# Patient Record
Sex: Female | Born: 1955 | Race: White | Hispanic: No | Smoking: Never smoker
Health system: Southern US, Community
[De-identification: ages and names within clinical notes are randomized; demographics above are authoritative.]

## PROBLEM LIST (undated history)

## (undated) ENCOUNTER — Emergency Department: Disposition: A | Discharge status: Home / Self Care | Payer: Medicare Other

## (undated) DIAGNOSIS — R161 Splenomegaly, not elsewhere classified: Secondary | ICD-10-CM

## (undated) DIAGNOSIS — F419 Anxiety disorder, unspecified: Secondary | ICD-10-CM

## (undated) DIAGNOSIS — F32A Depression, unspecified: Secondary | ICD-10-CM

## (undated) DIAGNOSIS — F329 Major depressive disorder, single episode, unspecified: Secondary | ICD-10-CM

## (undated) HISTORY — DX: Depression, unspecified: F32.A

## (undated) HISTORY — DX: Splenomegaly, not elsewhere classified: R16.1

## (undated) HISTORY — DX: Anxiety disorder, unspecified: F41.9

---
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
## 1993-10-03 HISTORY — PX: ABDOMINAL HYSTERECTOMY: SHX81

## 2013-05-03 DIAGNOSIS — F32A Depression, unspecified: Secondary | ICD-10-CM | POA: Insufficient documentation

## 2014-03-11 DIAGNOSIS — M6530 Trigger finger, unspecified finger: Secondary | ICD-10-CM | POA: Insufficient documentation

## 2014-05-23 DIAGNOSIS — G4733 Obstructive sleep apnea (adult) (pediatric): Secondary | ICD-10-CM | POA: Insufficient documentation

## 2017-10-16 DIAGNOSIS — R5383 Other fatigue: Secondary | ICD-10-CM | POA: Insufficient documentation

## 2018-07-04 DIAGNOSIS — E669 Obesity, unspecified: Secondary | ICD-10-CM | POA: Insufficient documentation

## 2018-07-13 DIAGNOSIS — K5792 Diverticulitis of intestine, part unspecified, without perforation or abscess without bleeding: Secondary | ICD-10-CM | POA: Insufficient documentation

## 2018-07-13 DIAGNOSIS — I10 Essential (primary) hypertension: Secondary | ICD-10-CM | POA: Insufficient documentation

## 2018-10-09 DIAGNOSIS — R768 Other specified abnormal immunological findings in serum: Secondary | ICD-10-CM | POA: Insufficient documentation

## 2019-12-05 ENCOUNTER — Encounter: Payer: Self-pay | Admitting: Pain Medicine

## 2019-12-05 ENCOUNTER — Telehealth: Payer: Self-pay

## 2019-12-05 DIAGNOSIS — Z79899 Other long term (current) drug therapy: Secondary | ICD-10-CM | POA: Insufficient documentation

## 2019-12-05 DIAGNOSIS — M899 Disorder of bone, unspecified: Secondary | ICD-10-CM | POA: Insufficient documentation

## 2019-12-05 DIAGNOSIS — G894 Chronic pain syndrome: Secondary | ICD-10-CM | POA: Insufficient documentation

## 2019-12-05 DIAGNOSIS — Z789 Other specified health status: Secondary | ICD-10-CM | POA: Insufficient documentation

## 2019-12-05 NOTE — Telephone Encounter (Signed)
Unable to reach patient , Left message for patient to return call so that we call review her history for Mondays appt.

## 2019-12-05 NOTE — Progress Notes (Deleted)
Patient: Mandy Miller  Service Category: E/M  Provider: Gaspar Cola, MD  DOB: Dec 21, 1955  DOS: 12/09/2019  Location: Office  MRN: [id number]  Setting: Ambulatory outpatient  Referring Provider: [facility], [facility] *  Type: New Patient  Specialty: Interventional Pain Management  PCP: [facility], [place]  Location: Remote location  Delivery: TeleHealth     Virtual Encounter - Pain Management PROVIDER NOTE: Information contained herein reflects review and annotations entered in association with encounter. Interpretation of such information and data should be left to medically-trained personnel. Information provided to patient can be located elsewhere in the medical record under "Patient Instructions". Document created using STT-dictation technology, any transcriptional errors that may result from process are unintentional.    Contact & Pharmacy Preferred: [place]: [phone] (home) Mobile: [phone] (mobile) E-mail: No e-mail address on record No Pharmacies Listed  Pre-screening note:  Our staff contacted Mandy Miller and offered her an "in person", "face-to-face" appointment versus a telephone encounter. She indicated preferring the telephone encounter, at this time.  Primary Reason(s) for Visit: Tele-Encounter for initial evaluation of one or more chronic problems (new to examiner) potentially causing chronic pain, and posing a threat to normal musculoskeletal function. (Level of risk: High) CC: No chief complaint on file.  I contacted Mandy Miller on 12/09/2019 via telephone.      I clearly identified myself as Gaspar Cola, MD. I verified that I was speaking with the correct person using two identifiers (Name: Mandy Miller, and date of birth: 1956-10-03).  Advanced Informed Consent I sought verbal advanced consent from Mandy Miller for virtual visit interactions. I informed Mandy Miller of possible security and privacy concerns, risks, and limitations associated with  providing "not-in-person" medical evaluation and management services. I also informed Mandy Miller of the availability of "in-person" appointments. Finally, I informed her that there would be a charge for the virtual visit and that she could be  personally, fully or partially, financially responsible for it. Mandy Miller expressed understanding and agreed to proceed.   HPI  Mandy Miller is a 64 y.o. year old, female patient, contacted today for an initial evaluation of her chronic pain. She has Chronic pain syndrome; Pharmacologic therapy; Disorder of skeletal system; and Problems influencing health status on their problem list.   Onset and Duration: Sudden, Started with accident and Present longer than 3 months Cause of pain: fell Severity: Getting worse, NAS-11 at its worse: 10/10, NAS-11 at its best: 4/10, NAS-11 now: 3/10 and NAS-11 on the average: 5/10 Timing: During activity or exercise and After activity or exercise Aggravating Factors: Working Alleviating Factors: nothing currently Associated Problems: Weakness, Pain that wakes patient up and Pain that does not allow patient to sleep Quality of Pain: Aching, Constant, Sharp and Uncomfortable Previous Examinations or Tests: The patient denies states she had a cortisone shot into shoulder but did not work well Previous Treatments: The patient denies has tried tylenol and ibuprofen and does not help   ***  Historic Controlled Substance Pharmacotherapy Review  Current opioid analgesics: Oxycodone IR 10 mg (#5 pills) (last prescribed 07/11/2019) Highest recorded MME/day: 30 mg/day MME/day: 18.75 mg/day   Historical Monitoring: The patient  has no history on file for drug. List of all UDS Test(s): No results found. List of other Serum/Urine Drug Screening Test(s):  No results found. Historical Background Evaluation: [place] PMP: PDMP reviewed during this encounter. Two (2) year initial data search conducted.             PMP  NARX Score Report:   Narcotic: 110 Sedative: 060 Stimulant: 000 Risk Assessment Profile: PMP NARX Overdose Risk Score: 230  Pharmacologic Plan: As per protocol, I have not taken over any controlled substance management, pending the results of ordered tests and/or consults.            Initial impression: Pending review of available data and ordered tests.  Meds  No current outpatient medications on file.  ROS  Cardiovascular: No reported cardiovascular signs or symptoms such as High blood pressure, coronary artery disease, abnormal heart rate or rhythm, heart attack, blood thinner therapy or heart weakness and/or failure Pulmonary or Respiratory: Temporary stoppage of breathing during sleep Neurological: No reported neurological signs or symptoms such as seizures, abnormal skin sensations, urinary and/or fecal incontinence, being born with an abnormal open spine and/or a tethered spinal cord Psychological-Psychiatric: No reported psychological or psychiatric signs or symptoms such as difficulty sleeping, anxiety, depression, delusions or hallucinations (schizophrenial), mood swings (bipolar disorders) or suicidal ideations or attempts Gastrointestinal: No reported gastrointestinal signs or symptoms such as vomiting or evacuating blood, reflux, heartburn, alternating episodes of diarrhea and constipation, inflamed or scarred liver, or pancreas or irrregular and/or infrequent bowel movements Genitourinary: No reported renal or genitourinary signs or symptoms such as difficulty voiding or producing urine, peeing blood, non-functioning kidney, kidney stones, difficulty emptying the bladder, difficulty controlling the flow of urine, or chronic kidney disease Hematological: No reported hematological signs or symptoms such as prolonged bleeding, low or poor functioning platelets, bruising or bleeding easily, hereditary bleeding problems, low energy levels due to low hemoglobin or being anemic Endocrine: No reported  endocrine signs or symptoms such as high or low blood sugar, rapid heart rate due to high thyroid levels, obesity or weight gain due to slow thyroid or thyroid disease Rheumatologic: No reported rheumatological signs and symptoms such as fatigue, joint pain, tenderness, swelling, redness, heat, stiffness, decreased range of motion, with or without associated rash Musculoskeletal: Negative for myasthenia gravis, muscular dystrophy, multiple sclerosis or malignant hyperthermia Work History: Working full time  Allergies  Ms. Collier has no allergies on file.  Laboratory Chemistry Profile   Renal No results found for: BUN, CREATININE, LABCREA, BCR, GFR, GFRAA, GFRNONAA, SPECGRAV, PHUR, PROTEINUR  Electrolytes No results found for: NA, K, CL, CALCIUM, MG, PHOS  Hepatic No results found for: AST, ALT, ALBUMIN, ALKPHOS, AMYLASE, LIPASE, AMMONIA  ID No results found for: LYMEIGGIGMAB, HIV, SARSCOV2NAA, STAPHAUREUS, MRSAPCR, HCVAB, PREGTESTUR, RMSFIGG, QFVRPH1IGG, QFVRPH2IGG, LYMEIGGIGMAB  Bone No results found for: VD25OH, XL244WN0UVO, ZD6644IH4, VQ2595GL8, 25OHVITD1, 25OHVITD2, 25OHVITD3, TESTOFREE, TESTOSTERONE  Endocrine No results found for: GLUCOSE, GLUCOSEU, HGBA1C, TSH, FREET4, TESTOFREE, TESTOSTERONE, SHBG, ESTRADIOL, ESTRADIOLPCT, ESTRADIOLFRE, LABPREG, ACTH, CRTSLPL, UCORFRPERLTR, UCORFRPERDAY, CORTISOLBASE, LABPREG  Neuropathy No results found for: VITAMINB12, FOLATE, HGBA1C, HIV  CNS No results found for: COLORCSF, APPEARCSF, RBCCOUNTCSF, WBCCSF, POLYSCSF, LYMPHSCSF, EOSCSF, PROTEINCSF, GLUCCSF, JCVIRUS, CSFOLI, IGGCSF, LABACHR, ACETBL, LABACHR, ACETBL  Inflammation (CRP: Acute  ESR: Chronic) No results found for: CRP, ESRSEDRATE, LATICACIDVEN  Rheumatology No results found for: RF, ANA, LABURIC, URICUR, LYMEIGGIGMAB, LYMEABIGMQN, HLAB27  Coagulation No results found for: INR, LABPROT, APTT, PLT, DDIMER, LABHEMA, VITAMINK1, AT3  Cardiovascular No results found for: BNP, CKTOTAL, CKMB,  TROPONINI, HGB, HCT, LABVMA, EPIRU, EPINEPH24HUR, NOREPRU, NOREPI24HUR, DOPARU, DOPAM24HRUR  Screening No results found for: SARSCOV2NAA, COVIDSOURCE, STAPHAUREUS, MRSAPCR, HCVAB, HIV, PREGTESTUR  Cancer No results found for: CEA, CA125, LABCA2  Allergens No results found for: ALMOND, APPLE, ASPARAGUS, AVOCADO, BANANA, BARLEY, BASIL, BAYLEAF, GREENBEAN, LIMABEAN, WHITEBEAN, BEEFIGE, REDBEET, BLUEBERRY, BROCCOLI, CABBAGE,  MELON, CARROT, CASEIN, CASHEWNUT, CAULIFLOWER, CELERY      Note: No results found under the [facility] electronic medical record  Imaging Review   Complexity Note: No results found under the [employer] electronic medical record.                         PFSH  Drug: Ms. Gauthreaux  has no history on file for drug. Alcohol:  has no history on file for alcohol. Tobacco:  has no history on file for tobacco. Medical:  has no past medical history on file. Family: family history is not on file.  *** The histories are not reviewed yet. Please review them in the "History" navigator section and refresh this [place]. Active Ambulatory Problems    Diagnosis Date Noted  . Chronic pain syndrome 12/05/2019  . Pharmacologic therapy 12/05/2019  . Disorder of skeletal system 12/05/2019  . Problems influencing health status 12/05/2019   Resolved Ambulatory Problems    Diagnosis Date Noted  . No Resolved Ambulatory Problems   No Additional Past Medical History   Assessment  Primary Diagnosis & Pertinent Problem List: The primary encounter diagnosis was Chronic pain syndrome. Diagnoses of Pharmacologic therapy, Disorder of skeletal system, and Problems influencing health status were also pertinent to this visit.  Visit Diagnosis (New problems to examiner): 1. Chronic pain syndrome   2. Pharmacologic therapy   3. Disorder of skeletal system   4. Problems influencing health status    Plan of Care (Initial workup plan)  Note: Ms. Nesser was reminded that as per protocol,  today's visit has been an evaluation only. We have not taken over the patient's controlled substance management.  Problem-specific plan: No problem-specific Assessment & Plan notes found for this encounter.  Lab Orders  No laboratory test(s) ordered today   Imaging Orders  No imaging studies ordered today   Referral Orders  No referral(s) requested today   Procedure Orders    No procedure(s) ordered today   Pharmacotherapy (current): Medications ordered:  No orders of the defined types were placed in this encounter.    Pharmacological management options:  Opioid Analgesics: The patient was informed that there is no guarantee that she would be a candidate for opioid analgesics. The decision will be made following CDC guidelines. This decision will be based on the results of diagnostic studies, as well as Ms. Soeder's risk profile.   Membrane stabilizer: To be determined at a later time  Muscle relaxant: To be determined at a later time  NSAID: To be determined at a later time  Other analgesic(s): To be determined at a later time   Interventional management options: Ms. Koehler was informed that there is no guarantee that she would be a candidate for interventional therapies. The decision will be based on the results of diagnostic studies, as well as Ms. Lajeunesse's risk profile.  Procedure(s) under consideration:  ***   Provider-requested follow-up: No follow-ups on file.  Future Appointments  Date Time Provider [place]  12/09/2019  2:30 PM Milinda Pointer, MD [facility] None   Total duration of encounter: *** minutes.  Primary Care Physician: Medicine, [place] Of Note by: Gaspar Cola, MD Date: 12/09/2019; Time: 6:46 AM

## 2019-12-09 ENCOUNTER — Ambulatory Visit: Payer: Medicare Other | Attending: Pain Medicine | Admitting: Pain Medicine

## 2019-12-09 ENCOUNTER — Other Ambulatory Visit: Payer: Self-pay

## 2019-12-09 DIAGNOSIS — G894 Chronic pain syndrome: Secondary | ICD-10-CM

## 2019-12-17 ENCOUNTER — Ambulatory Visit: Payer: Medicare Other | Admitting: Pain Medicine

## 2019-12-17 ENCOUNTER — Ambulatory Visit
Admission: RE | Admit: 2019-12-17 | Discharge: 2019-12-17 | Disposition: A | Discharge status: Home / Self Care | Payer: Medicare Other | Source: Ambulatory Visit | Attending: Pain Medicine | Admitting: Pain Medicine

## 2019-12-17 ENCOUNTER — Encounter: Payer: Self-pay | Admitting: Pain Medicine

## 2019-12-17 ENCOUNTER — Other Ambulatory Visit: Payer: Self-pay

## 2019-12-17 VITALS — BP 143/85 | HR 77 | Temp 97.3°F | Resp 16 | Ht 63.0 in | Wt 171.0 lb

## 2019-12-17 DIAGNOSIS — M792 Neuralgia and neuritis, unspecified: Secondary | ICD-10-CM | POA: Insufficient documentation

## 2019-12-17 DIAGNOSIS — G8929 Other chronic pain: Secondary | ICD-10-CM | POA: Insufficient documentation

## 2019-12-17 DIAGNOSIS — Z789 Other specified health status: Secondary | ICD-10-CM | POA: Insufficient documentation

## 2019-12-17 DIAGNOSIS — M899 Disorder of bone, unspecified: Secondary | ICD-10-CM

## 2019-12-17 DIAGNOSIS — M542 Cervicalgia: Secondary | ICD-10-CM | POA: Insufficient documentation

## 2019-12-17 DIAGNOSIS — M25532 Pain in left wrist: Secondary | ICD-10-CM | POA: Insufficient documentation

## 2019-12-17 DIAGNOSIS — M25512 Pain in left shoulder: Secondary | ICD-10-CM | POA: Insufficient documentation

## 2019-12-17 DIAGNOSIS — Z79899 Other long term (current) drug therapy: Secondary | ICD-10-CM

## 2019-12-17 DIAGNOSIS — G894 Chronic pain syndrome: Secondary | ICD-10-CM | POA: Insufficient documentation

## 2019-12-17 MED ORDER — GABAPENTIN 100 MG PO CAPS: ORAL_CAPSULE | ORAL | 0 refills | Status: DC

## 2019-12-17 NOTE — Progress Notes (Signed)
Safety precautions to be maintained throughout the outpatient stay will include: orient to surroundings, keep bed in low position, maintain call bell within reach at all times, provide assistance with transfer out of bed and ambulation.  

## 2019-12-17 NOTE — Progress Notes (Signed)
Patient: Mandy Miller  Service Category: E/M  Provider: Gaspar Cola, MD  DOB: 06/11/1956  DOS: 12/17/2019  Referring Provider: Medicine, [place]  MRN: [id number]  Setting: Ambulatory outpatient  PCP: [facility], [place] [facility]  Type: New Patient  Specialty: Interventional Pain Management    Location: Office  Delivery: Face-to-face     Primary Reason(s) for Visit: Encounter for initial evaluation of one or more chronic problems (new to examiner) potentially causing chronic pain, and posing a threat to normal musculoskeletal function. (Level of risk: High) CC: Wrist Pain (left) and Shoulder Pain (left)  HPI  Ms. Mandy Miller is a 64 y.o. year old, female patient, who comes today to see [place] for the first time for an initial evaluation of her chronic pain. She has Chronic pain syndrome; Pharmacologic therapy; Disorder of skeletal system; Problems influencing health status; Abnormal hepatitis serology; Depression; Diverticulitis; Essential hypertension; Fatigue; Obesity (BMI 30.0-34.9); Obstructive sleep apnea; Trigger finger, acquired; Chronic shoulder pain (Left); Chronic wrist pain (Left); Neurogenic pain; and Cervicalgia on their problem list. Today she comes in for evaluation of her Wrist Pain (left) and Shoulder Pain (left)  Pain Assessment: Location: Left Shoulder Radiating: entire left arm Onset: More than a month ago Duration: Chronic pain Quality: Nagging, Constant, Other (Comment)(weakness) Severity: 9 /10 (subjective, self-reported pain score)  Note: Reported level is compatible with observation.                         When using our objective Pain Scale, levels between 6 and 10/10 are said to belong in an emergency room, as it progressively worsens from a 6/10, described as severely limiting, requiring emergency care not usually available at an outpatient pain management facility. At a 6/10 level, communication becomes difficult and requires great effort. Assistance to reach the emergency  department may be required. Facial flushing and profuse sweating along with potentially dangerous increases in heart rate and blood pressure will be evident. Effect on ADL: difficulty performing daily activities Timing: Constant Modifying factors: medications, Ibuprofen BP: (!) 143/85  HR: 77  Onset and Duration: Gradual and Present longer than 3 months Cause of pain: Trauma Severity: Getting worse, No change since onset, NAS-11 at its worse: 10/10, NAS-11 at its best: 6/10, NAS-11 now: 9/10 and NAS-11 on the average: 9/10 Timing: Night, Not influenced by the time of the day and During activity or exercise Aggravating Factors: Lifiting, Motion and Working Alleviating Factors: Hot packs and Medications Associated Problems: Weakness, Pain that wakes patient up and Pain that does not allow patient to sleep Quality of Pain: Agonizing, Constant, Disabling, Dull, Heavy, Nagging, Sharp, Throbbing and Toothache-like Previous Examinations or Tests: MRI scan and Orthopedic evaluation Previous Treatments: Epidural steroid injections, Narcotic medications and Steroid treatments by mouth  The patient comes into the clinics today for the first time for a chronic pain management evaluation.  According to the patient her primary area of pain is that of the left shoulder.  She denies any surgeries, physical therapy, but she does admit to having had a shoulder injection and some x-rays as well as an MRI.  She indicates that the pain wakes her up at night and it does not allow her to carry on her work activities, since she is left-handed.  The patient indicates that she was evaluated and referred over to [place] by Dr. Peggye Form, orthopedic surgeon at [facility].  She also indicates that she was offered surgery to fix the problem with the  shoulder, but she has refused.  Today I have a long conversation with the patient regarding her unrealistic expectations since she is under the impression that we will somehow do something  that will eliminate the pain and allow her to go back to work.  I have explained to the patient that this is not how things work and it is very unlikely that we will be able to do something that will magically eliminate her shoulder problems so as to have her go back to work like if nothing had happened.  She indicates that she knows that is not the case, but still the way that she is describing the situation, it still gives me the impression that she is in for a disappointment.  The patient's secondary area pain is that of the left wrist.  Again she denies any surgeries, physical therapy, injection therapy, but does admit to some x-rays.  All of these appear to have been done at [facility] since they are not available to [facility].  Today's physical exam was positive for guarding of the left shoulder with some decreased range of motion (limited).  Of significance is the fact that when I checked the patient's cervical spine range of motion she had referred pain towards the left shoulder upon rotating the cervical spine towards the left and also went doing lateral bending towards the left suggesting the possibility of a left foraminal stenosis, in addition to the problem seen on the patient's MRI.  Reviewing the patient's medications, I see that she was placed on trazodone in an effort to help her sleep at night, but she is already on Zoloft, which she has been taking since her pregnancy.  Because of the two antidepressants, there is always the possibility of a serotonin syndrome.  The patient indicates that it may help her with her sleep, but it does not really help with the pain.  She also mentioned that she is pending to have some sleep studies to apparently reevaluate her sleep apnea.  She is morbidly obese and likely to have obstructive sleep apnea.  Today I took the time to provide the patient with information regarding my pain practice. The patient was informed that my practice is divided into two  sections: an interventional pain management section, as well as a completely separate and distinct medication management section. I explained that I have procedure days for my interventional therapies, and evaluation days for follow-ups and medication management. Because of the amount of documentation required during both, they are kept separated. This means that there is the possibility that she may be scheduled for a procedure on one day, and medication management the next. I have also informed her that because of staffing and facility limitations, I no longer take patients for medication management only. To illustrate the reasons for this, I gave the patient the example of surgeons, and how inappropriate it would be to refer a patient to his/her care, just to write for the post-surgical antibiotics on a surgery done by a different surgeon.   Because interventional pain management is my board-certified specialty, the patient was informed that joining my practice means that they are open to any and all interventional therapies. I made it clear that this does not mean that they will be forced to have any procedures done. What this means is that I believe interventional therapies to be essential part of the diagnosis and proper management of chronic pain conditions. Therefore, patients not interested in these interventional alternatives will be  better served under the care of a different practitioner.  The patient was also made aware of my Comprehensive Pain Management Safety Guidelines where by joining my practice, they limit all of their nerve blocks and joint injections to those done by our practice, for as long as we are retained to manage their care.   Historic Controlled Substance Pharmacotherapy Review  PMP and historical list of controlled substances: Oxycodone/APAP 5/325; APAP/codeine # 3 (Tylenol #3); oxycodone IR 10 mg. Highest opioid analgesic regimen found: Oxycodone/APAP 5/325 1 tab PO q 6-8  hrs Most recent opioid analgesic: Oxycodone IR 10 mg 1 tab PO QD Current opioid analgesics: None Highest recorded MME/day: 30 mg/day MME/day: 0 mg/day  Medications: The patient did not bring the medication(s) to the appointment, as requested in our "New Patient Package" Pharmacodynamics: Desired effects: Analgesia: The patient reports >50% benefit. Reported improvement in function: The patient reports medication allows her to accomplish basic ADLs. Clinically meaningful improvement in function (CMIF): Sustained CMIF goals met Perceived effectiveness: Described as relatively effective, allowing for increase in activities of daily living (ADL) Undesirable effects: Side-effects or Adverse reactions: None reported Historical Monitoring: The patient  reports no history of drug use. List of all UDS Test(s): No results found. List of other Serum/Urine Drug Screening Test(s):  No results found. Historical Background Evaluation: [place] PMP: PDMP reviewed during this encounter. Six (6) year initial data search conducted.             PMP NARX Score Report:  Narcotic: 110 Sedative: 060 Stimulant: 000 [place] Department of public safety, offender search: Editor, commissioning Information) Non-contributory Risk Assessment Profile: Aberrant behavior: None observed or detected today Risk factors for fatal opioid overdose: None identified today PMP NARX Overdose Risk Score: 230 Fatal overdose hazard ratio (HR): Calculation deferred Non-fatal overdose hazard ratio (HR): Calculation deferred Risk of opioid abuse or dependence: 0.7-3.0% with doses ? 36 MME/day and 6.1-26% with doses ? 120 MME/day. Substance use disorder (SUD) risk level: See below Personal History of Substance Abuse (SUD-Substance use disorder):  Alcohol: Negative  Illegal Drugs: Negative  Rx Drugs: Negative  ORT Risk Level calculation: Low Risk Opioid Risk Tool - 12/17/19 1339      Family History of Substance Abuse   Alcohol  Negative    Illegal  Drugs  Negative    Rx Drugs  Negative      Personal History of Substance Abuse   Alcohol  Negative    Illegal Drugs  Negative    Rx Drugs  Negative      Age   Age between 71-45 years   No      History of Preadolescent Sexual Abuse   History of Preadolescent Sexual Abuse  Negative or Female      Psychological Disease   Psychological Disease  Negative    ADD  Negative    OCD  Negative    Bipolar  Negative    Schizophrenia  Negative    Depression  Negative      Total Score   Opioid Risk Tool Scoring  0    Opioid Risk Interpretation  Low Risk      ORT Scoring interpretation table:  Score <3 = Low Risk for SUD  Score between 4-7 = Moderate Risk for SUD  Score >8 = High Risk for Opioid Abuse   PHQ-2 Depression Scale:  Total score: 0  PHQ-2 Scoring interpretation table: (Score and probability of major depressive disorder)  Score 0 = No depression  Score 1 =  15.4% Probability  Score 2 = 21.1% Probability  Score 3 = 38.4% Probability  Score 4 = 45.5% Probability  Score 5 = 56.4% Probability  Score 6 = 78.6% Probability   PHQ-9 Depression Scale:  Total score: 0  PHQ-9 Scoring interpretation table:  Score 0-4 = No depression  Score 5-9 = Mild depression  Score 10-14 = Moderate depression  Score 15-19 = Moderately severe depression  Score 20-27 = Severe depression (2.4 times higher risk of SUD and 2.89 times higher risk of overuse)   Pharmacologic Plan: As per protocol, I have not taken over any controlled substance management, pending the results of ordered tests and/or consults.            Initial impression: Pending review of available data and ordered tests.  Meds   Current Outpatient Medications:  .  famotidine (PEPCID) 20 MG tablet, Take by mouth., Disp: , Rfl:  .  psyllium (METAMUCIL) 58.6 % packet, Take 1 packet by mouth daily., Disp: , Rfl:  .  sertraline (ZOLOFT) 100 MG tablet, Take 100 mg by mouth daily., Disp: , Rfl:  .  traZODone (DESYREL) 100 MG tablet,  Take 100 mg by mouth at bedtime., Disp: , Rfl:  .  gabapentin (NEURONTIN) 100 MG capsule, Take 1 capsule (100 mg total) by mouth at bedtime for 15 days, THEN 2 capsules (200 mg total) at bedtime for 15 days, THEN 3 capsules (300 mg total) at bedtime for 15 days. Follow written titration schedule., Disp: 90 capsule, Rfl: 0  Imaging Review   Complexity Note: No results found under the [place] record.                        ROS  Cardiovascular: No reported cardiovascular signs or symptoms such as High blood pressure, coronary artery disease, abnormal heart rate or rhythm, heart attack, blood thinner therapy or heart weakness and/or failure Pulmonary or Respiratory: Temporary stoppage of breathing during sleep Neurological: No reported neurological signs or symptoms such as seizures, abnormal skin sensations, urinary and/or fecal incontinence, being born with an abnormal open spine and/or a tethered spinal cord Psychological-Psychiatric: Anxiousness Gastrointestinal: No reported gastrointestinal signs or symptoms such as vomiting or evacuating blood, reflux, heartburn, alternating episodes of diarrhea and constipation, inflamed or scarred liver, or pancreas or irrregular and/or infrequent bowel movements Genitourinary: Passing kidney stones Hematological: No reported hematological signs or symptoms such as prolonged bleeding, low or poor functioning platelets, bruising or bleeding easily, hereditary bleeding problems, low energy levels due to low hemoglobin or being anemic Endocrine: No reported endocrine signs or symptoms such as high or low blood sugar, rapid heart rate due to high thyroid levels, obesity or weight gain due to slow thyroid or thyroid disease Rheumatologic: No reported rheumatological signs and symptoms such as fatigue, joint pain, tenderness, swelling, redness, heat, stiffness, decreased range of motion, with or without associated rash Musculoskeletal:  Negative for myasthenia gravis, muscular dystrophy, multiple sclerosis or malignant hyperthermia Work History: Working full time  Allergies  Ms. Polidore has No Known Allergies.  Laboratory Chemistry Profile   Renal No results found for: BUN, CREATININE, LABCREA, BCR, GFR, GFRAA, GFRNONAA, SPECGRAV, PHUR, PROTEINUR  Electrolytes No results found for: NA, K, CL, CALCIUM, MG, PHOS  Hepatic No results found for: AST, ALT, ALBUMIN, ALKPHOS, AMYLASE, LIPASE, AMMONIA  ID No results found for: LYMEIGGIGMAB, HIV, SARSCOV2NAA, STAPHAUREUS, MRSAPCR, HCVAB, PREGTESTUR, RMSFIGG, QFVRPH1IGG, QFVRPH2IGG, LYMEIGGIGMAB  Bone No results found for: VD25OH, [id number],  [id number], [id number], [place], [place], [place], TESTOFREE, TESTOSTERONE  Endocrine No results found for: GLUCOSE, GLUCOSEU, HGBA1C, TSH, FREET4, TESTOFREE, TESTOSTERONE, SHBG, ESTRADIOL, ESTRADIOLPCT, ESTRADIOLFRE, LABPREG, ACTH, CRTSLPL, UCORFRPERLTR, UCORFRPERDAY, CORTISOLBASE, LABPREG  Neuropathy No results found for: VITAMINB12, FOLATE, HGBA1C, HIV  CNS No results found for: COLORCSF, APPEARCSF, RBCCOUNTCSF, WBCCSF, POLYSCSF, LYMPHSCSF, EOSCSF, PROTEINCSF, GLUCCSF, JCVIRUS, CSFOLI, IGGCSF, LABACHR, ACETBL, LABACHR, ACETBL  Inflammation (CRP: Acute  ESR: Chronic) No results found for: CRP, ESRSEDRATE, LATICACIDVEN  Rheumatology No results found for: RF, ANA, LABURIC, URICUR, LYMEIGGIGMAB, LYMEABIGMQN, HLAB27  Coagulation No results found for: INR, LABPROT, APTT, PLT, DDIMER, LABHEMA, VITAMINK1, AT3  Cardiovascular No results found for: BNP, CKTOTAL, CKMB, TROPONINI, HGB, HCT, LABVMA, EPIRU, EPINEPH24HUR, NOREPRU, NOREPI24HUR, DOPARU, DOPAM24HRUR  Screening No results found for: [place], COVIDSOURCE, STAPHAUREUS, MRSAPCR, HCVAB, HIV, PREGTESTUR  Cancer No results found for: CEA, CA125, LABCA2  Allergens No results found for: ALMOND, APPLE, ASPARAGUS, AVOCADO, BANANA, BARLEY, BASIL, BAYLE[place], GREENBEAN, LIMABEAN, WHITEBEAN,  BEEFIGE, REDBEET, BLUEBERRY, BROCCOLI, CABBAGE, MELON, CARROT, CASEIN, CASHEWNUT, CAULIFLOWER, CELERY     Note: No results found under the [employer] electronic medical record  [facility]  Drug: Ms. Bey  reports no history of drug use. Alcohol:  reports current alcohol use. Tobacco:  reports that she has never smoked. She has never used smokeless tobacco. Medical:  has a past medical history of Anxiety and Depression. Family: family history is not on file.  Past Surgical History:  Procedure Laterality Date  . ABDOMINAL HYSTERECTOMY  1995   partial   Active Ambulatory Problems    Diagnosis Date Noted  . Chronic pain syndrome 12/05/2019  . Pharmacologic therapy 12/05/2019  . Disorder of skeletal system 12/05/2019  . Problems influencing health status 12/05/2019  . Abnormal hepatitis serology 10/09/2018  . Depression 05/03/2013  . Diverticulitis 07/13/2018  . Essential hypertension 07/13/2018  . Fatigue 10/16/2017  . Obesity (BMI 30.0-34.9) 07/04/2018  . Obstructive sleep apnea 05/23/2014  . Trigger finger, acquired 03/11/2014  . Chronic shoulder pain (Left) 12/17/2019  . Chronic wrist pain (Left) 12/17/2019  . Neurogenic pain 12/17/2019  . Cervicalgia 12/17/2019   Resolved Ambulatory Problems    Diagnosis Date Noted  . No Resolved Ambulatory Problems   Past Medical History:  Diagnosis Date  . Anxiety    Constitutional Exam  General appearance: Well nourished, well developed, and well hydrated. In no apparent acute distress Vitals:   12/17/19 1334  BP: (!) 143/85  Pulse: 77  Resp: 16  Temp: (!) 97.3 F (36.3 C)  TempSrc: Temporal  SpO2: 98%  Weight: 171 lb (77.6 kg)  Height: _0  (1.6 m)   BMI Assessment: Estimated body mass index is 30.29 kg/m as calculated from the following:   Height as of this encounter: _1  (1.6 m).   Weight as of this encounter: 171 lb (77.6 kg).  BMI interpretation table: BMI level Category Range association with higher incidence  of chronic pain  <18 kg/m2 Underweight   18.5-24.9 kg/m2 Ideal body weight   25-29.9 kg/m2 Overweight Increased incidence by 20%  30-34.9 kg/m2 Obese (Class I) Increased incidence by 68%  35-39.9 kg/m2 Severe obesity (Class II) Increased incidence by 136%  >40 kg/m2 Extreme obesity (Class III) Increased incidence by 254%   Patient's current BMI Ideal Body weight  Body mass index is 30.29 kg/m. Ideal body weight: 52.4 kg (115 lb 8.3 oz) Adjusted ideal body weight: 62.5 kg (137 lb 11.4 oz)   BMI Readings from Last 4 Encounters:  12/17/19 30.29 kg/m   Wt  Readings from Last 4 Encounters:  12/17/19 171 lb (77.6 kg)    Psych/Mental status: Alert, oriented x 3 (person, place, & time)       Eyes: PERLA Respiratory: No evidence of acute respiratory distress  Cervical Spine Exam  Skin & Axial Inspection: No masses, redness, edema, swelling, or associated skin lesions Alignment: Symmetrical Functional ROM: Decreased ROM, to the left Stability: No instability detected Muscle Tone/Strength: Guarding detected Sensory (Neurological): Movement-associated pain Palpation: Complains of area being tender to palpation              Upper Extremity (UE) Exam    Side: Right upper extremity  Side: Left upper extremity  Skin & Extremity Inspection: Skin color, temperature, and hair growth are WNL. No peripheral edema or cyanosis. No masses, redness, swelling, asymmetry, or associated skin lesions. No contractures.  Skin & Extremity Inspection: Skin color, temperature, and hair growth are WNL. No peripheral edema or cyanosis. No masses, redness, swelling, asymmetry, or associated skin lesions. No contractures.  Functional ROM: Unrestricted ROM          Functional ROM: Decreased ROM for shoulder  Muscle Tone/Strength: Functionally intact. No obvious neuro-muscular anomalies detected.  Muscle Tone/Strength: TEFL teacher (Neurological): Unimpaired          Sensory (Neurological): Movement-associated  pain affecting the shoulder  Palpation: No palpable anomalies              Palpation: Complains of area being tender to palpation              Provocative Test(s):  Phalen's test: deferred Tinel's test: deferred Apley's scratch test (touch opposite shoulder):  Action 1 (Across chest): Adequate ROM Action 2 (Overhead): Adequate ROM Action 3 (LB reach): Adequate ROM   Provocative Test(s):  Phalen's test: deferred Tinel's test: deferred Apley's scratch test (touch opposite shoulder):  Action 1 (Across chest): Adequate ROM Action 2 (Overhead): Decreased ROM Action 3 (LB reach): Decreased ROM    Thoracic Spine Area Exam  Skin & Axial Inspection: No masses, redness, or swelling Alignment: Symmetrical Functional ROM: Unrestricted ROM Stability: No instability detected Muscle Tone/Strength: Functionally intact. No obvious neuro-muscular anomalies detected. Sensory (Neurological): Unimpaired Muscle strength & Tone: No palpable anomalies  Lumbar Exam  Skin & Axial Inspection: No masses, redness, or swelling Alignment: Symmetrical Functional ROM: Unrestricted ROM       Stability: No instability detected Muscle Tone/Strength: Functionally intact. No obvious neuro-muscular anomalies detected. Sensory (Neurological): Unimpaired Palpation: No palpable anomalies       Provocative Tests: Hyperextension/rotation test: deferred today       Lumbar quadrant test (Kemp's test): deferred today       Lateral bending test: deferred today       Patrick's Maneuver: deferred today                   FABER* test: deferred today                   S-I anterior distraction/compression test: deferred today         S-I lateral compression test: deferred today         S-I Thigh-thrust test: deferred today         S-I Gaenslen's test: deferred today         *(Flexion, ABduction and External Rotation)  Gait & Posture Assessment  Ambulation: Unassisted Gait: Relatively normal for age and body  habitus Posture: WNL   Lower Extremity Exam    Side: Right  lower extremity  Side: Left lower extremity  Stability: No instability observed          Stability: No instability observed          Skin & Extremity Inspection: Skin color, temperature, and hair growth are WNL. No peripheral edema or cyanosis. No masses, redness, swelling, asymmetry, or associated skin lesions. No contractures.  Skin & Extremity Inspection: Skin color, temperature, and hair growth are WNL. No peripheral edema or cyanosis. No masses, redness, swelling, asymmetry, or associated skin lesions. No contractures.  Functional ROM: Unrestricted ROM                  Functional ROM: Unrestricted ROM                  Muscle Tone/Strength: Functionally intact. No obvious neuro-muscular anomalies detected.  Muscle Tone/Strength: Functionally intact. No obvious neuro-muscular anomalies detected.  Sensory (Neurological): Unimpaired        Sensory (Neurological): Unimpaired        DTR: Patellar: deferred today Achilles: deferred today Plantar: deferred today  DTR: Patellar: deferred today Achilles: deferred today Plantar: deferred today  Palpation: No palpable anomalies  Palpation: No palpable anomalies   Assessment  Primary Diagnosis & Pertinent Problem List: The primary encounter diagnosis was Chronic pain syndrome. Diagnoses of Chronic shoulder pain (Left), Pharmacologic therapy, Disorder of skeletal system, Problems influencing health status, Chronic wrist pain (Left), Neurogenic pain, and Cervicalgia were also pertinent to this visit.  Visit Diagnosis (New problems to examiner): 1. Chronic pain syndrome   2. Chronic shoulder pain (Left)   3. Pharmacologic therapy   4. Disorder of skeletal system   5. Problems influencing health status   6. Chronic wrist pain (Left)   7. Neurogenic pain   8. Cervicalgia    Plan of Care (Initial workup plan)  Note: Ms. Pollitt was reminded that as per protocol, today's visit has been an  evaluation only. We have not taken over the patient's controlled substance management.  Problem-specific plan: No problem-specific Assessment & Plan notes found for this encounter.   Lab Orders     Compliance Drug Analysis, Ur     Comp. Metabolic Panel (12)     Magnesium     Vitamin B12     Sedimentation rate     25-Hydroxy vitamin D Lcms D2+D3     C-reactive protein  Imaging Orders     DG Cervical Spine With Flex & Extend     DG Shoulder Left     DG Wrist Complete Left Referral Orders  No referral(s) requested today   Procedure Orders    No procedure(s) ordered today   Pharmacotherapy (current): Medications ordered:  Meds ordered this encounter  Medications  . gabapentin (NEURONTIN) 100 MG capsule    Sig: Take 1 capsule (100 mg total) by mouth at bedtime for 15 days, THEN 2 capsules (200 mg total) at bedtime for 15 days, THEN 3 capsules (300 mg total) at bedtime for 15 days. Follow written titration schedule.    Dispense:  90 capsule    Refill:  0    Fill one day early if pharmacy is closed on scheduled refill date. May substitute for generic if available.   Medications administered during this visit: Tinita Brooker had no medications administered during this visit.   Pharmacological management options:  Opioid Analgesics: The patient was informed that there is no guarantee that she would be a candidate for opioid analgesics. The decision will be made following  CDC guidelines. This decision will be based on the results of diagnostic studies, as well as Ms. Buis's risk profile.   Membrane stabilizer: To be determined at a later time  Muscle relaxant: To be determined at a later time  NSAID: To be determined at a later time  Other analgesic(s): To be determined at a later time   Interventional management options: Ms. Nery was informed that there is no guarantee that she would be a candidate for interventional therapies. The decision will be based on the results of  diagnostic studies, as well as Ms. Homer's risk profile.  Procedure(s) under consideration:  Diagnostic left suprascapular nerve block  Possible left suprascapular nerve RFA  Diagnostic left IA shoulder joint injection    Provider-requested follow-up: Return for (VV), (2V), (s/p Tests).  No future appointments.  Note by: Gaspar Cola, MD Date: 12/17/2019; Time: 2:46 PM

## 2019-12-17 NOTE — Patient Instructions (Signed)
____________________________________________________________________________________________  General Risks and Possible Complications  Patient Responsibilities: It is important that you read this as it is part of your informed consent. It is our duty to inform you of the risks and possible complications associated with treatments offered to you. It is your responsibility as a patient to read this and to ask questions about anything that is not clear or that you believe was not covered in this document.  Patient's Rights: You have the right to refuse treatment. You also have the right to change your mind, even after initially having agreed to have the treatment done. However, under this last option, if you wait until the last second to change your mind, you may be charged for the materials used up to that point.  Introduction: Medicine is not an exact science. Everything in Medicine, including the lack of treatment(s), carries the potential for danger, harm, or loss (which is by definition: Risk). In Medicine, a complication is a secondary problem, condition, or disease that can aggravate an already existing one. All treatments carry the risk of possible complications. The fact that a side effects or complications occurs, does not imply that the treatment was conducted incorrectly. It must be clearly understood that these can happen even when everything is done following the highest safety standards.  No treatment: You can choose not to proceed with the proposed treatment alternative. The "PRO(s)" would include: avoiding the risk of complications associated with the therapy. The "CON(s)" would include: not getting any of the treatment benefits. These benefits fall under one of three categories: diagnostic; therapeutic; and/or palliative. Diagnostic benefits include: getting information which can ultimately lead to improvement of the disease or symptom(s). Therapeutic benefits are those associated with the  successful treatment of the disease. Finally, palliative benefits are those related to the decrease of the primary symptoms, without necessarily curing the condition (example: decreasing the pain from a flare-up of a chronic condition, such as incurable terminal cancer).  General Risks and Complications: These are associated to most interventional treatments. They can occur alone, or in combination. They fall under one of the following six (6) categories: no benefit or worsening of symptoms; bleeding; infection; nerve damage; allergic reactions; and/or death. 1. No benefits or worsening of symptoms: In Medicine there are no guarantees, only probabilities. No healthcare provider can ever guarantee that a medical treatment will work, they can only state the probability that it may. Furthermore, there is always the possibility that the condition may worsen, either directly, or indirectly, as a consequence of the treatment. 2. Bleeding: This is more common if the patient is taking a blood thinner, either prescription or over the counter (example: Goody Powders, Fish oil, Aspirin, Garlic, etc.), or if suffering a condition associated with impaired coagulation (example: Hemophilia, cirrhosis of the liver, low platelet counts, etc.). However, even if you do not have one on these, it can still happen. If you have any of these conditions, or take one of these drugs, make sure to notify your treating physician. 3. Infection: This is more common in patients with a compromised immune system, either due to disease (example: diabetes, cancer, human immunodeficiency virus [HIV], etc.), or due to medications or treatments (example: therapies used to treat cancer and rheumatological diseases). However, even if you do not have one on these, it can still happen. If you have any of these conditions, or take one of these drugs, make sure to notify your treating physician. 4. Nerve Damage: This is more common when the   treatment is  an invasive one, but it can also happen with the use of medications, such as those used in the treatment of cancer. The damage can occur to small secondary nerves, or to large primary ones, such as those in the spinal cord and brain. This damage may be temporary or permanent and it may lead to impairments that can range from temporary numbness to permanent paralysis and/or brain death. 5. Allergic Reactions: Any time a substance or material comes in contact with our body, there is the possibility of an allergic reaction. These can range from a mild skin rash (contact dermatitis) to a severe systemic reaction (anaphylactic reaction), which can result in death. 6. Death: In general, any medical intervention can result in death, most of the time due to an unforeseen complication. ____________________________________________________________________________________________   ______________________________________________________________________________________________  Specialty Pain Scale  Introduction:  There are significant differences in how pain is reported. The word pain usually refers to physical pain, but it is also a common synonym of suffering. The medical community uses a scale from 0 (zero) to 10 (ten) to report pain level. Zero (0) is described as "no pain", while ten (10) is described as "the worse pain you can imagine". The problem with this scale is that physical pain is reported along with suffering. Suffering refers to mental pain, or more often yet it refers to any unpleasant feeling, emotion or aversion associated with the perception of harm or threat of harm. It is the psychological component of pain.  Pain Specialists prefer to separate the two components. The pain scale used by this practice is the Verbal Numerical Rating Scale (VNRS-11). This scale is for the physical pain only. DO NOT INCLUDE how your pain psychologically affects you. This scale is for adults 21 years of age and  older. It has 11 (eleven) levels. The 1st level is 0/10. This means: "right now, I have no pain". In the context of pain management, it also means: "right now, my physical pain is under control with the current therapy".  General Information:  The scale should reflect your current level of pain. Unless you are specifically asked for the level of your worst pain, or your average pain. If you are asked for one of these two, then it should be understood that it is over the past 24 hours.  Levels 1 (one) through 5 (five) are described below, and can be treated as an outpatient. Ambulatory pain management facilities such as ours are more than adequate to treat these levels. Levels 6 (six) through 10 (ten) are also described below, however, these must be treated as a hospitalized patient. While levels 6 (six) and 7 (seven) may be evaluated at an urgent care facility, levels 8 (eight) through 10 (ten) constitute medical emergencies and as such, they belong in a hospital's emergency department. When having these levels (as described below), do not come to our office. Our facility is not equipped to manage these levels. Go directly to an urgent care facility or an emergency department to be evaluated.  Definitions:  Activities of Daily Living (ADL): Activities of daily living (ADL or ADLs) is a term used in healthcare to refer to people's daily self-care activities. Health professionals often use a person's ability or inability to perform ADLs as a measurement of their functional status, particularly in regard to people post injury, with disabilities and the elderly. There are two ADL levels: Basic and Instrumental. Basic Activities of Daily Living (BADL  or BADLs) consist of self-care   tasks that include: Bathing and showering; personal hygiene and grooming (including brushing/combing/styling hair); dressing; Toilet hygiene (getting to the toilet, cleaning oneself, and getting back up); eating and self-feeding (not  including cooking or chewing and swallowing); functional mobility, often referred to as "transferring", as measured by the ability to walk, get in and out of bed, and get into and out of a chair; the broader definition (moving from one place to another while performing activities) is useful for people with different physical abilities who are still able to get around independently. Basic ADLs include the things many people do when they get up in the morning and get ready to go out of the house: get out of bed, go to the toilet, bathe, dress, groom, and eat. On the average, loss of function typically follows a particular order. Hygiene is the first to go, followed by loss of toilet use and locomotion. The last to go is the ability to eat. When there is only one remaining area in which the person is independent, there is a 62.9% chance that it is eating and only a 3.5% chance that it is hygiene. Instrumental Activities of Daily Living (IADL or IADLs) are not necessary for fundamental functioning, but they let an individual live independently in a community. IADL consist of tasks that include: cleaning and maintaining the house; home establishment and maintenance; care of others (including selecting and supervising caregivers); care of pets; child rearing; managing money; managing financials (investments, etc.); meal preparation and cleanup; shopping for groceries and necessities; moving within the community; safety procedures and emergency responses; health management and maintenance (taking prescribed medications); and using the telephone or other form of communication.  Instructions:  Most patients tend to report their pain as a combination of two factors, their physical pain and their psychosocial pain. This last one is also known as "suffering" and it is reflection of how physical pain affects you socially and psychologically. From now on, report them separately.  From this point on, when asked to report  your pain level, report only your physical pain. Use the following table for reference.  Pain Clinic Pain Levels (0-5/10)  Pain Level Score  Description  No Pain 0   Mild pain 1 Nagging, annoying, but does not interfere with basic activities of daily living (ADL). Patients are able to eat, bathe, get dressed, toileting (being able to get on and off the toilet and perform personal hygiene functions), transfer (move in and out of bed or a chair without assistance), and maintain continence (able to control bladder and bowel functions). Blood pressure and heart rate are unaffected. A normal heart rate for a healthy adult ranges from 60 to 100 bpm (beats per minute).   Mild to moderate pain 2 Noticeable and distracting. Impossible to hide from other people. More frequent flare-ups. Still possible to adapt and function close to normal. It can be very annoying and may have occasional stronger flare-ups. With discipline, patients may get used to it and adapt.   Moderate pain 3 Interferes significantly with activities of daily living (ADL). It becomes difficult to feed, bathe, get dressed, get on and off the toilet or to perform personal hygiene functions. Difficult to get in and out of bed or a chair without assistance. Very distracting. With effort, it can be ignored when deeply involved in activities.   Moderately severe pain 4 Impossible to ignore for more than a few minutes. With effort, patients may still be able to manage work or participate in   some social activities. Very difficult to concentrate. Signs of autonomic nervous system discharge are evident: dilated pupils (mydriasis); mild sweating (diaphoresis); sleep interference. Heart rate becomes elevated (>115 bpm). Diastolic blood pressure (lower number) rises above 100 mmHg. Patients find relief in laying down and not moving.   Severe pain 5 Intense and extremely unpleasant. Associated with frowning face and frequent crying. Pain overwhelms the  senses.  Ability to do any activity or maintain social relationships becomes significantly limited. Conversation becomes difficult. Pacing back and forth is common, as getting into a comfortable position is nearly impossible. Pain wakes you up from deep sleep. Physical signs will be obvious: pupillary dilation; increased sweating; goosebumps; brisk reflexes; cold, clammy hands and feet; nausea, vomiting or dry heaves; loss of appetite; significant sleep disturbance with inability to fall asleep or to remain asleep. When persistent, significant weight loss is observed due to the complete loss of appetite and sleep deprivation.  Blood pressure and heart rate becomes significantly elevated. Caution: If elevated blood pressure triggers a pounding headache associated with blurred vision, then the patient should immediately seek attention at an urgent or emergency care unit, as these may be signs of an impending stroke.    Emergency Department Pain Levels (6-10/10)  Emergency Room Pain 6 Severely limiting. Requires emergency care and should not be seen or managed at an outpatient pain management facility. Communication becomes difficult and requires great effort. Assistance to reach the emergency department may be required. Facial flushing and profuse sweating along with potentially dangerous increases in heart rate and blood pressure will be evident.   Distressing pain 7 Self-care is very difficult. Assistance is required to transport, or use restroom. Assistance to reach the emergency department will be required. Tasks requiring coordination, such as bathing and getting dressed become very difficult.   Disabling pain 8 Self-care is no longer possible. At this level, pain is disabling. The individual is unable to do even the most "basic" activities such as walking, eating, bathing, dressing, transferring to a bed, or toileting. Fine motor skills are lost. It is difficult to think clearly.   Incapacitating pain  9 Pain becomes incapacitating. Thought processing is no longer possible. Difficult to remember your own name. Control of movement and coordination are lost.   The worst pain imaginable 10 At this level, most patients pass out from pain. When this level is reached, collapse of the autonomic nervous system occurs, leading to a sudden drop in blood pressure and heart rate. This in turn results in a temporary and dramatic drop in blood flow to the brain, leading to a loss of consciousness. Fainting is one of the body's self defense mechanisms. Passing out puts the brain in a calmed state and causes it to shut down for a while, in order to begin the healing process.    Summary: 1.   Refer to this scale when providing us with your pain level. 2.   Be accurate and careful when reporting your pain level. This will help with your care. 3.   Over-reporting your pain level will lead to loss of credibility. 4.   Even a level of 1/10 means that there is pain and will be treated at our facility. 5.   High, inaccurate reporting will be documented as "Symptom Exaggeration", leading to loss of credibility and suspicions of possible secondary gains such as obtaining more narcotics, or wanting to appear disabled, for fraudulent reasons. 6.   Only pain levels of 5 or below will be seen   at our facility. 7.   Pain levels of 6 and above will be sent to the Emergency Department and the appointment cancelled.  ______________________________________________________________________________________________     

## 2019-12-20 LAB — COMPLIANCE DRUG ANALYSIS, UR

## 2019-12-23 LAB — COMP. METABOLIC PANEL (12)
AST: 19 IU/L (ref 0–40)
Albumin/Globulin Ratio: 1.7 (ref 1.2–2.2)
Albumin: 4.4 g/dL (ref 3.8–4.8)
Alkaline Phosphatase: 69 IU/L (ref 39–117)
BUN/Creatinine Ratio: 25 (ref 12–28)
BUN: 20 mg/dL (ref 8–27)
Bilirubin Total: 0.3 mg/dL (ref 0.0–1.2)
Calcium: 9.7 mg/dL (ref 8.7–10.3)
Chloride: 104 mmol/L (ref 96–106)
Creatinine, Ser: 0.79 mg/dL (ref 0.57–1.00)
GFR calc Af Amer: 91 mL/min/{1.73_m2} (ref >59)
GFR calc non Af Amer: 79 mL/min/{1.73_m2} (ref >59)
Globulin, Total: 2.6 g/dL (ref 1.5–4.5)
Glucose: 96 mg/dL (ref 65–99)
Potassium: 4.2 mmol/L (ref 3.5–5.2)
Sodium: 143 mmol/L (ref 134–144)
Total Protein: 7 g/dL (ref 6.0–8.5)

## 2019-12-23 LAB — 25-HYDROXY VITAMIN D LCMS D2+D3
25-Hydroxy, Vitamin D-2: <1 ng/mL
25-Hydroxy, Vitamin D-3: 38 ng/mL
25-Hydroxy, Vitamin D: 38 ng/mL

## 2019-12-23 LAB — VITAMIN B12: Vitamin B-12: 982 pg/mL (ref 232–1245)

## 2019-12-23 LAB — C-REACTIVE PROTEIN: CRP: 2 mg/L (ref 0–10)

## 2019-12-23 LAB — MAGNESIUM: Magnesium: 2.2 mg/dL (ref 1.6–2.3)

## 2019-12-23 LAB — SEDIMENTATION RATE: Sed Rate: 10 mm/hr (ref 0–40)

## 2020-12-13 IMAGING — CR DG WRIST COMPLETE 3+V*L*
1 series · 4 of 4 positions shown · non-contrast
Comparison: None.

CLINICAL DATA: Chronic left wrist pain, fracture a year ago

EXAM:
LEFT WRIST - COMPLETE 3+ VIEW

[Series 1: dg wrist complete left · 0.14mm/px · 4 of 4 slices shown]
[im 1/4]
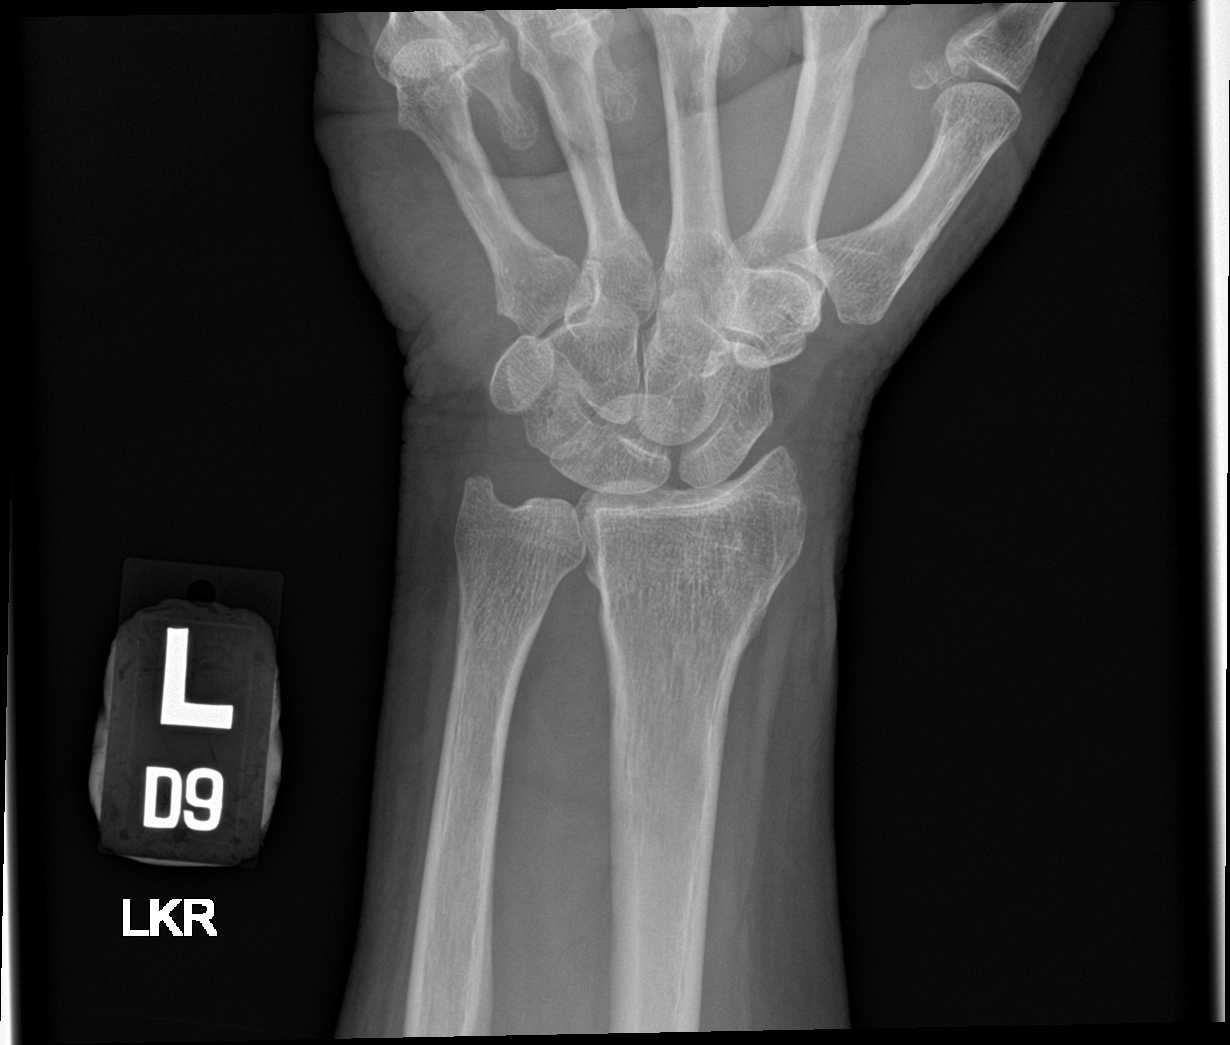
[im 2/4]
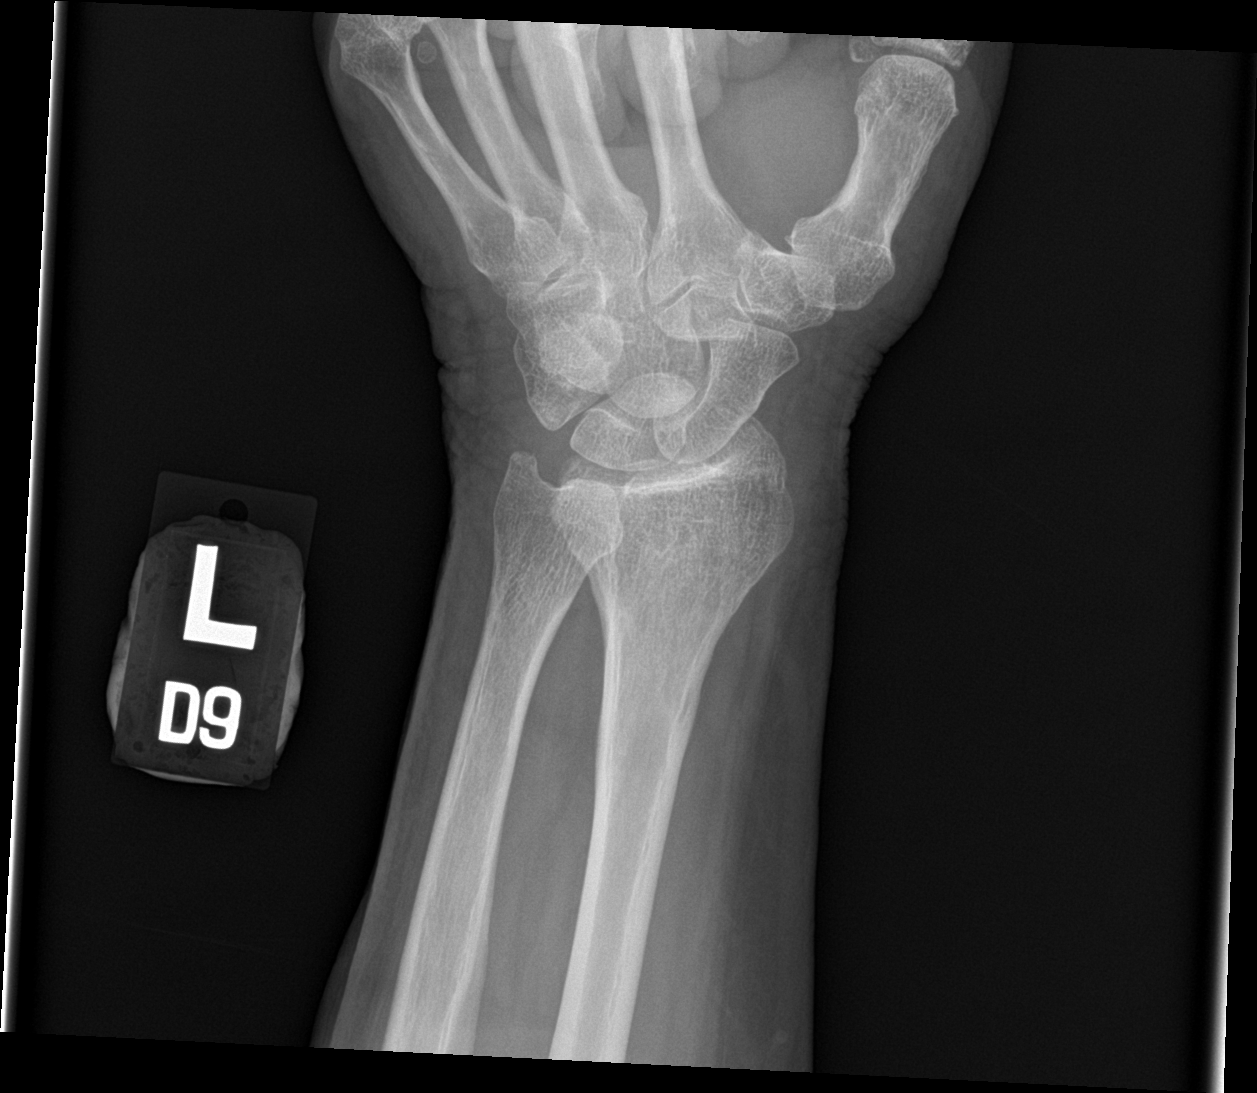
[im 3/4]
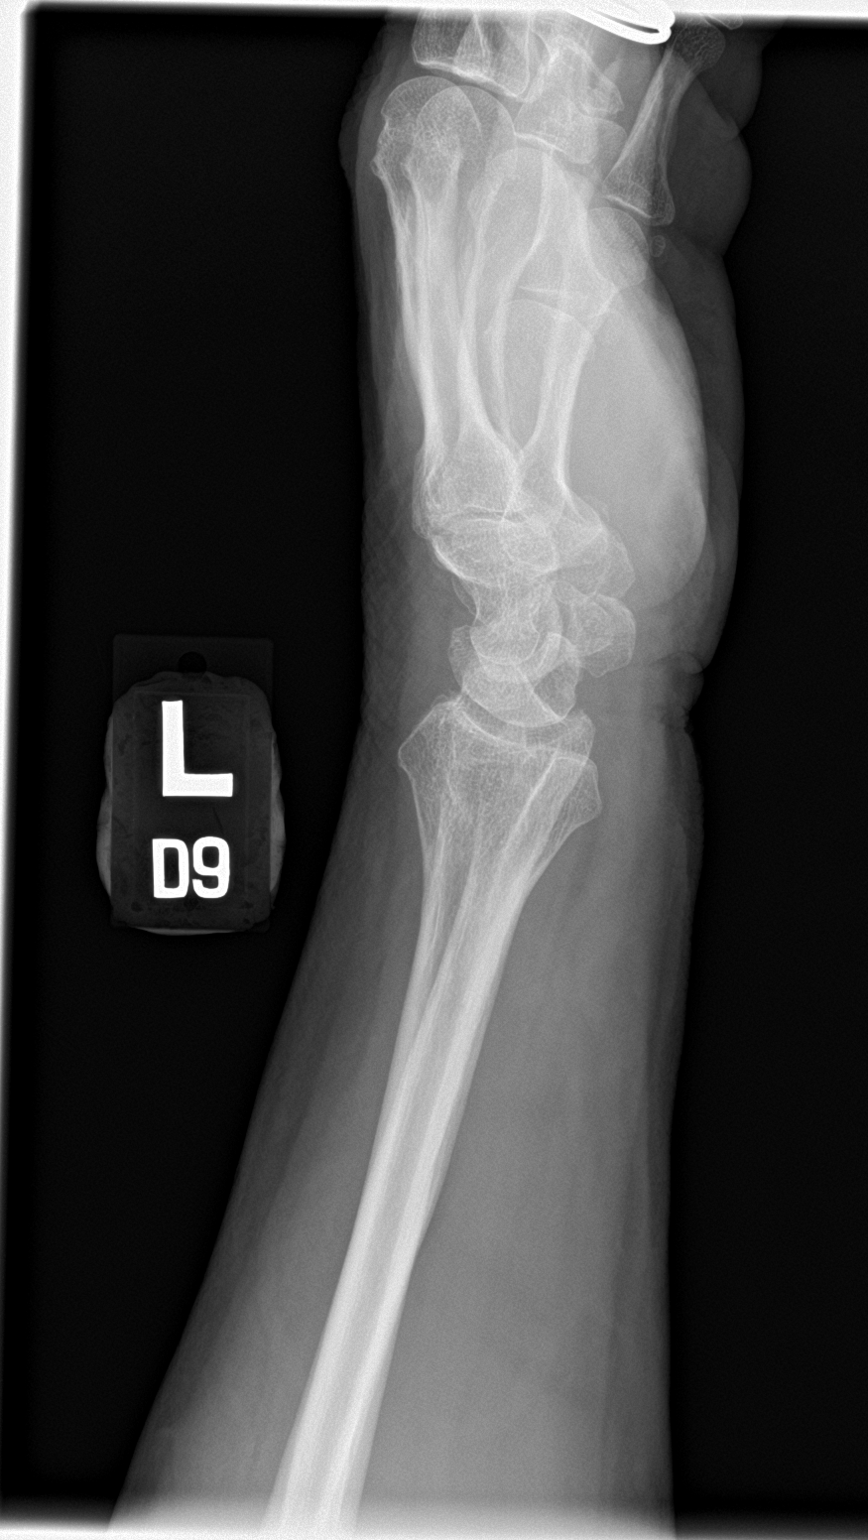
[im 4/4]
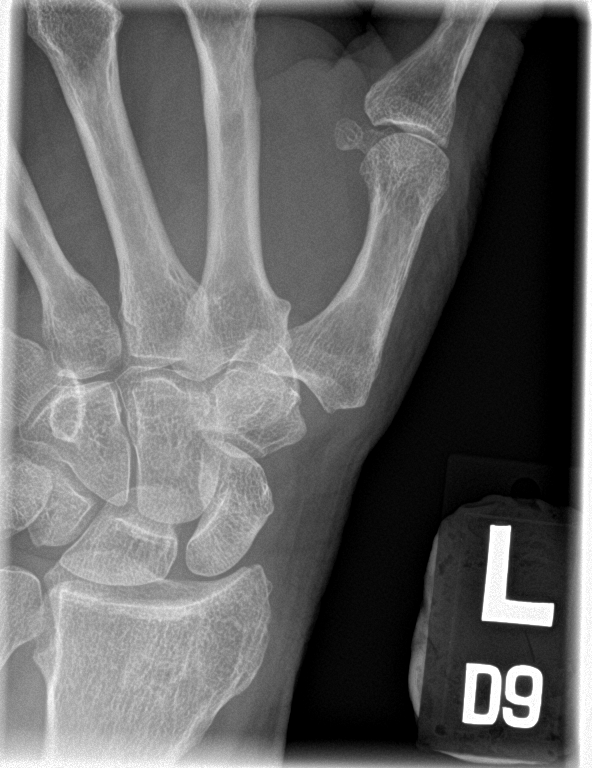

[4 of 4 positions shown; findings below may reference images not displayed]

FINDINGS: No acute fracture or dislocation of the left wrist. Reported prior
fracture is not obvious on this examination although there may be
some corticated irregularity of the distal left radial
metadiaphysis. The carpus is normally aligned. Mild first
carpometacarpal arthrosis without other significant arthrosis. Soft
tissues are unremarkable.
IMPRESSION: No acute fracture or dislocation of the left wrist. Reported prior
fracture is not obvious on this examination although there may be
some corticated irregularity of the distal left radial
metadiaphysis.

## 2020-12-13 IMAGING — CR DG SHOULDER 2+V*L*
1 series · 3 of 3 positions shown · non-contrast
Comparison: None.

CLINICAL DATA: Left shoulder pain

EXAM:
LEFT SHOULDER - 2+ VIEW

[Series 1: dg shoulder left · 0.14mm/px · 3 of 3 slices shown]
[im 1/3]
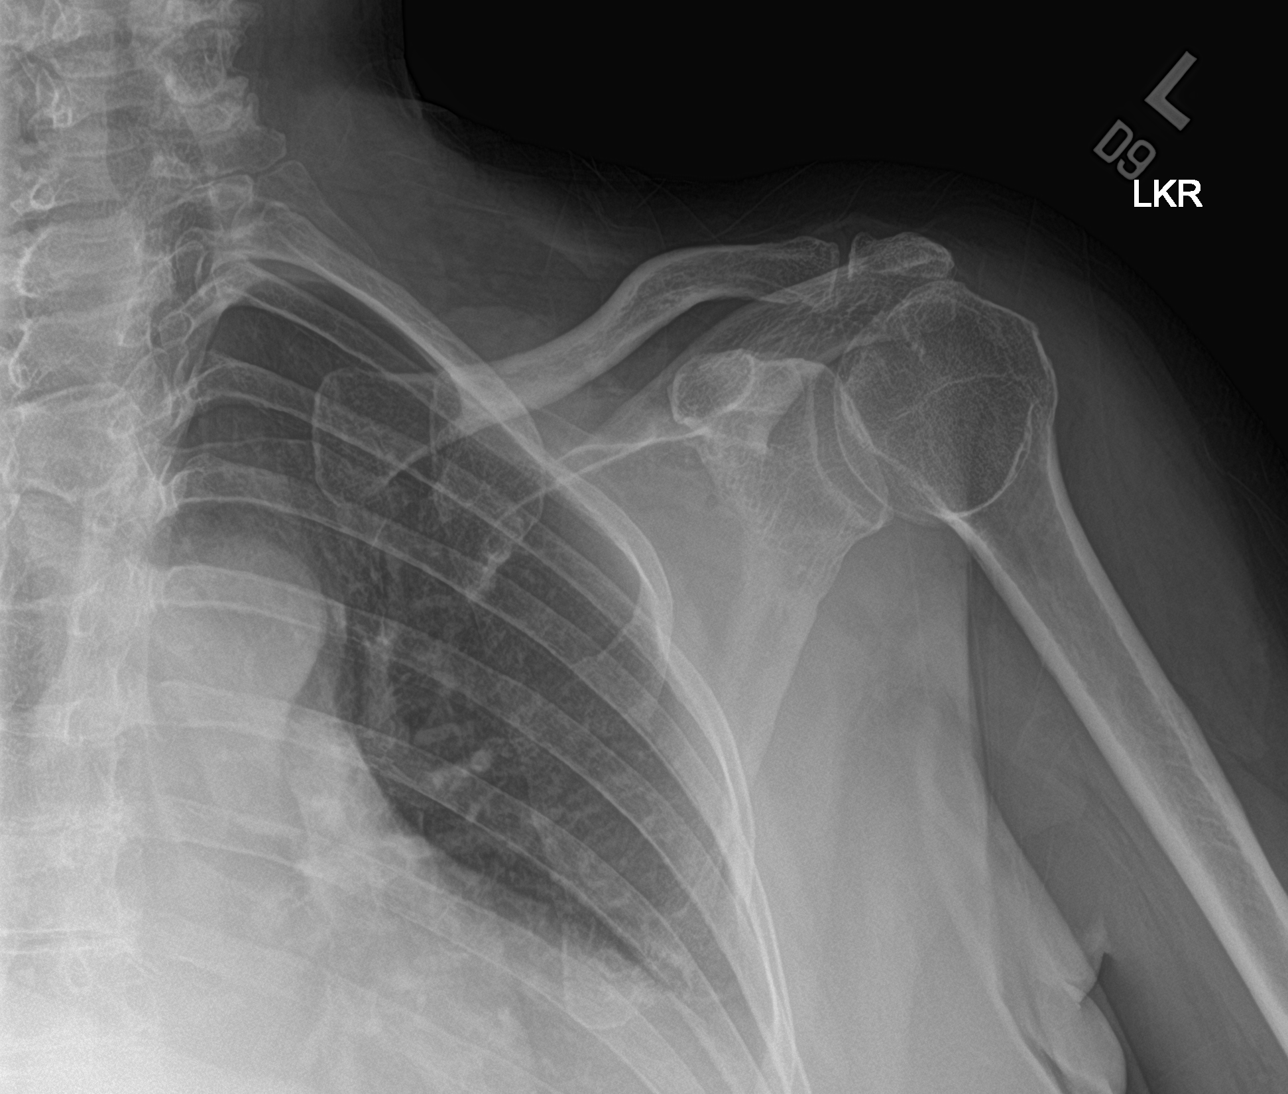
[im 2/3]
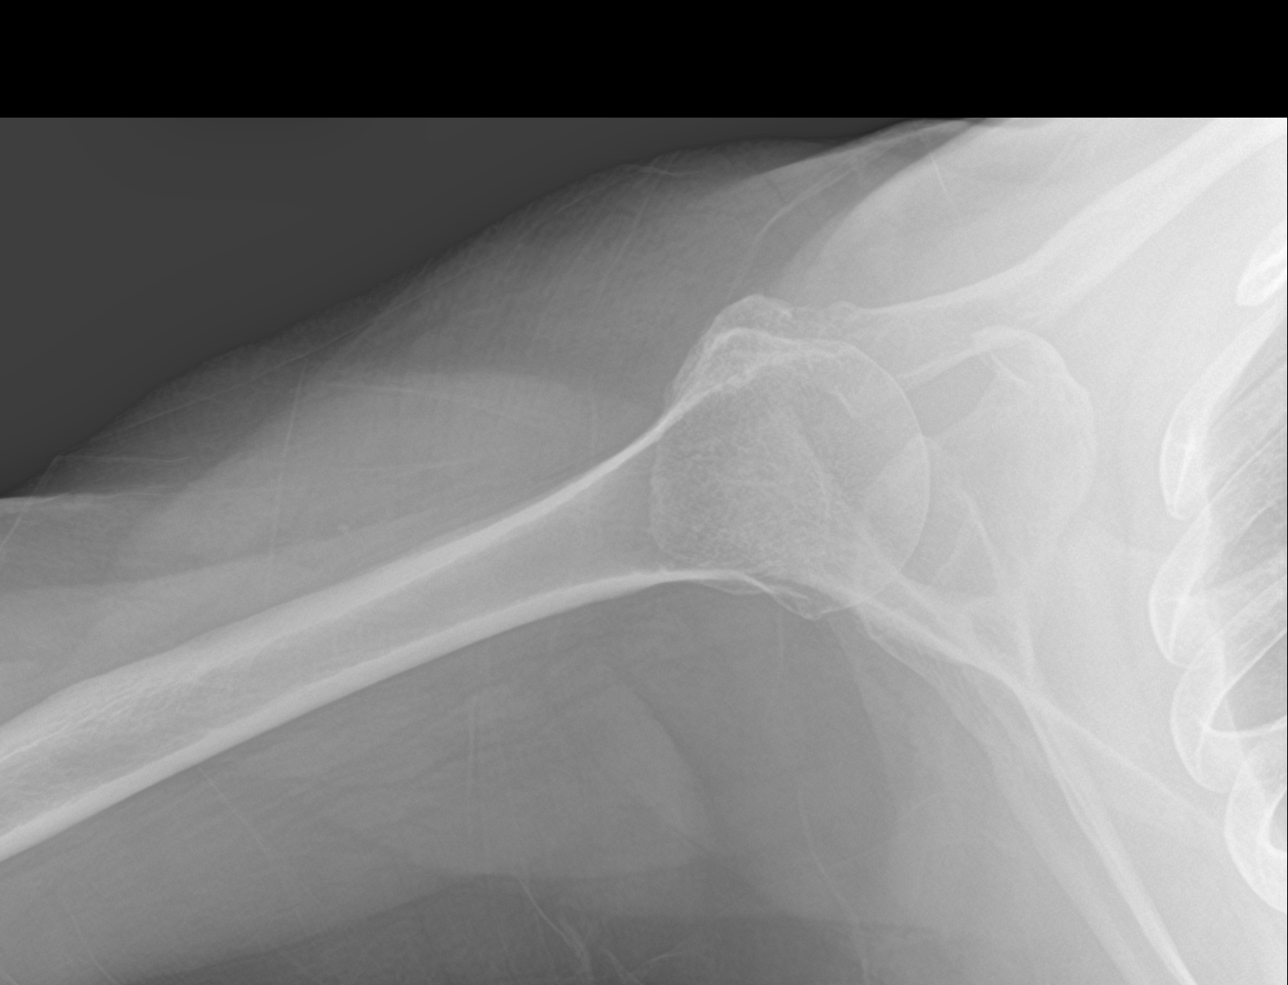
[im 3/3]
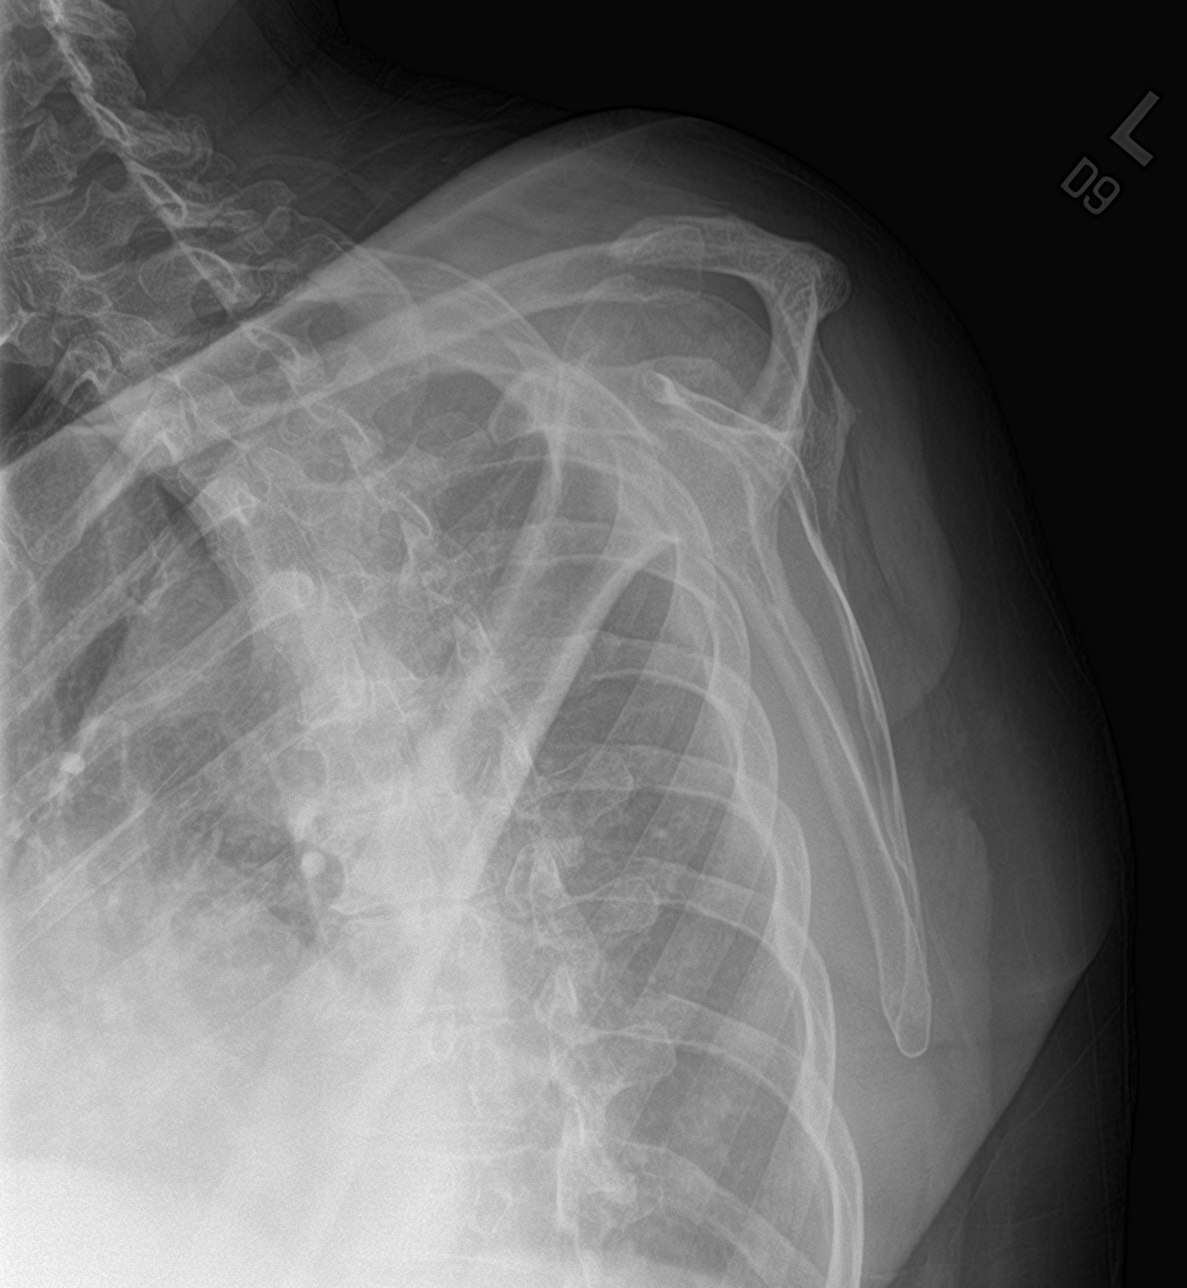

[3 of 3 positions shown; findings below may reference images not displayed]

FINDINGS: There is no evidence of fracture or dislocation. There is no
evidence of arthropathy or other focal bone abnormality. Soft
tissues are unremarkable.
IMPRESSION: No fracture or dislocation of the left shoulder. Joint spaces are
preserved.

## 2021-01-26 ENCOUNTER — Other Ambulatory Visit: Payer: Self-pay

## 2021-01-26 ENCOUNTER — Ambulatory Visit: Payer: Medicare Other | Attending: Physician Assistant

## 2021-01-26 DIAGNOSIS — M5417 Radiculopathy, lumbosacral region: Secondary | ICD-10-CM | POA: Insufficient documentation

## 2021-01-26 DIAGNOSIS — M545 Low back pain, unspecified: Secondary | ICD-10-CM | POA: Diagnosis present

## 2021-01-26 DIAGNOSIS — M6281 Muscle weakness (generalized): Secondary | ICD-10-CM | POA: Insufficient documentation

## 2021-01-26 NOTE — Patient Instructions (Signed)
Seated hip extension isometrics   Sitting on a chair,    Squeeze your rear end muscles together and press your right foot onto the floor.     Hold for 5 seconds    Repeat 10 times   Perform 3 sets daily.      This is a corrective exercise. Once you no longer have symptoms, you can stop.   

## 2021-01-26 NOTE — Therapy (Signed)
[facility]ORTS MEDICINE [address], [place], [address] Phone: [phone]   Fax:  [phone]  Physical Therapy Evaluation  Patient Details  Name: Mandy Miller MRN: [id number] Date of Birth: 12/09/1955 Referring Provider (PT): Christen Bame, [place]    Encounter Date: 01/26/2021   PT End of Session - 01/26/21 1330    Visit Number 1    Number of Visits 17    Date for PT Re-Evaluation 03/25/21    Authorization Type 1    Authorization Time Period of 10 progress report    PT Start Time 1330    PT Stop Time 1413    PT Time Calculation (min) 43 min    Activity Tolerance Patient tolerated treatment well    Behavior During Therapy [facility] for tasks assessed/performed           Past Medical History:  Diagnosis Date  . Anxiety   . Depression     Past Surgical History:  Procedure Laterality Date  . ABDOMINAL HYSTERECTOMY  1995   partial    There were no vitals filed for this visit.    Subjective Assessment - 01/26/21 1333    Subjective R low back pain: (pt took meloxicam) 5/10 currently, 9/10 at most for the past month.    Pertinent History SI joint pain. Pt states having R low back pain which radiates along the L5 dermatome to the ankle. Pain began 2 months ago. Pt cleans an thinks that the tasks involved may have played a factor. Pt woke up one morning with the pain. Denies loss of bowel or bladder function or LE paresthesia.    Patient Stated Goals Get rid of the pain.    Currently in Pain? Yes    Pain Score 5     Pain Location Back    Pain Orientation Right;Lower    Pain Type Acute pain    Pain Radiating Towards R L 5 dermatome to R ankle    Pain Onset More than a month ago    Pain Frequency Constant    Aggravating Factors  Turning over in bed, using the vacuum cleaner, clean the ceiling fan, clean the base boards. Standing for a while bothers her back sometimes.    Pain Relieving Factors Meloxicam helps a lot.   Sitting. Exercises such as supine SKTC, S/L clamshells, supine piriformis stretch.              [facility] PT Assessment - 01/26/21 1342      Assessment   Medical Diagnosis SI joint inflammation    Referring Provider (PT) Christen Bame, PA-C    Onset Date/Surgical Date 12/30/20      Precautions   Precaution Comments No known precautions      Restrictions   Other Position/Activity Restrictions No known restrictions      Posture/Postural Control   Posture Comments forward neck, backward lean, L lateral shift, R upper trunk rotation, R greater trochanter and knee higher.      AROM   Lumbar Flexion full with R posterior thigh symptoms.    Lumbar Extension WFL    Lumbar - Right Side [place] with R posterior hip symptoms    Lumbar - Left Side Bend WFL    Lumbar - Right Rotation WFL with R posterior hip symptoms.    Lumbar - Left Rotation [facility]      PROM   Right Hip Internal Rotation  43    Left Hip Internal Rotation  26      Strength   Right Hip Flexion 4-/5   with R posterior hip symptoms   Right Hip Extension 4-/5   with posterior thigh symptoms   Right Hip ABduction 4-/5   with R posterior lateral hip pain, eases with rest.   Left Hip Flexion 4/5    Left Hip Extension 4-/5    Left Hip ABduction 4/5    Right Knee Flexion 4+/5    Right Knee Extension 4+/5    Left Knee Flexion 4+/5    Left Knee Extension 5/5      Special Tests   Other special tests Long sit test: R LE longer in supine and long sit position.                      Objective measurements completed on examination: See above findings.   No latex band allergy Blood pressure is controlled per pt.   Decreased R low back pain with L lateral shift manual correction by PT  Therapeutic exercise   Seated hip extension isometrics   R 10x5 seconds for 3 sets  Possible decrease in pain  Standing L lateral shift isometrics 10x5 seconds      Improved exercise technique, movement at target joints, use  of target muscles after mod verbal, visual, tactile cues.    Response to treatment Decreased R hip pain reported after session.    Clinical impression Pt is a 65 year old female who came to physical therapy secondary to R low back pain. She also presents with R LE radiating symptoms along the L5 dermatome to the ankle, altered posture, B hip weakness, reproduction of symptoms with lumbar movements, limited L hip mobility, and difficulty performing tasks such as chores and bed mobility due to pain. Pt will benefit from skilled physical therapy services to address the aforementioned deficits.                   PT Education - 01/26/21 1628    Education Details ther-ex, HEP, plan of care    Person(s) Educated Patient    Methods Explanation;Demonstration;Tactile cues;Verbal cues;Handout    Comprehension Verbalized understanding;Returned demonstration            PT Short Term Goals - 01/26/21 1636      PT SHORT TERM GOAL #1   Title Patient will be independent with her initial HEP to decrease pain, improve strength and ability to perform chores and bed mobility more comfortably.    Baseline Pt has started her HEP. (01/26/2021)    Time 3    Period Weeks    Status New    Target Date 01/28/21             PT Long Term Goals - 01/26/21 1637      PT LONG TERM GOAL #1   Title Pt will have a decrease in low back pain to 4/10 or less at most to promote ability to perform chores and bed mobility more comfortably.    Baseline 9/10 low back pain at most for the past month (01/26/2021)    Time 8    Period Weeks    Status New    Target Date 03/25/21      PT LONG TERM GOAL #2   Title Pt will improve bilatearl hip extension and abduction strength by at least 1/2 MMT grade to promote ability to vacuum, clean, perform work duties more comfortably.    Baseline Hip extension 4-/5  R and L, hip abduction 4-/5 R, 4/5 L (01/26/2021)    Time 8    Period Weeks    Status New    Target  Date 03/25/21      PT LONG TERM GOAL #3   Title Pt will improve FOTO score by at least 10 points as a demonstration of improved function.    Baseline Lumbar FOTO 47 (01/26/2021)    Time 8    Period Weeks    Status New    Target Date 03/25/21                  Plan - 01/26/21 1630    Clinical Impression Statement Pt is a 65 year old female who came to physical therapy secondary to R low back pain. She also presents with R LE radiating symptoms along the L5 dermatome to the ankle, altered posture, B hip weakness, reproduction of symptoms with lumbar movements, limited L hip mobility, and difficulty performing tasks such as chores and bed mobility due to pain. Pt will benefit from skilled physical therapy services to address the aforementioned deficits.    Personal Factors and Comorbidities Comorbidity 2    Comorbidities Anxiety, depression    Examination-Activity Limitations Bed Mobility;Bend;Reach Overhead    Stability/Clinical Decision Making Stable/Uncomplicated    Clinical Decision Making Low    Rehab Potential Fair    PT Frequency 2x / week    PT Duration 8 weeks    PT Treatment/Interventions Therapeutic activities;Therapeutic exercise;Neuromuscular re-education;Patient/family education;Manual techniques;Dry needling;Spinal Manipulations;Joint Manipulations;Electrical Stimulation;Iontophoresis 4mg /ml Dexamethasone;Traction    PT Next Visit Plan posture, trunk, hip strengthening, manual techniques, modalities PRN    Consulted and Agree with Plan of Care Patient           Patient will benefit from skilled therapeutic intervention in order to improve the following deficits and impairments:  Pain,Postural dysfunction,Improper body mechanics,Decreased strength  Visit Diagnosis: Acute right-sided low back pain, unspecified whether sciatica present - Plan: PT plan of care cert/re-cert  Radiculopathy, lumbosacral region - Plan: PT plan of care cert/re-cert  Muscle weakness  (generalized) - Plan: PT plan of care cert/re-cert     Problem List Patient Active Problem List   Diagnosis Date Noted  . Chronic shoulder pain (Left) 12/17/2019  . Chronic wrist pain (Left) 12/17/2019  . Neurogenic pain 12/17/2019  . Cervicalgia 12/17/2019  . Chronic pain syndrome 12/05/2019  . Pharmacologic therapy 12/05/2019  . Disorder of skeletal system 12/05/2019  . Problems influencing health status 12/05/2019  . Abnormal hepatitis serology 10/09/2018  . Diverticulitis 07/13/2018  . Essential hypertension 07/13/2018  . Obesity (BMI 30.0-34.9) 07/04/2018  . Fatigue 10/16/2017  . Obstructive sleep apnea 05/23/2014  . Trigger finger, acquired 03/11/2014  . Depression 05/03/2013    07/03/2013 PT, DPT   01/26/2021, 5:00 PM  [facility] PHYS[facility] AND SPORTS MEDICINE [address], [address], [place] Phone: [phone]   Fax:  [phone]  Name: Mandy Miller MRN: Shon Millet Date of Birth: 07-04-1956

## 2021-01-28 ENCOUNTER — Ambulatory Visit: Payer: Medicare Other

## 2021-02-02 ENCOUNTER — Ambulatory Visit: Payer: Medicare Other

## 2021-02-04 ENCOUNTER — Ambulatory Visit: Payer: Medicare Other

## 2021-02-09 ENCOUNTER — Ambulatory Visit: Payer: Medicare Other | Attending: Physician Assistant

## 2021-02-09 ENCOUNTER — Telehealth: Payer: Self-pay

## 2021-02-09 NOTE — Telephone Encounter (Signed)
No show. Called patient and left a message pertaining to appointment and a reminder for the next follow up session. Return phone call requested. Phone number ([phone]) provided.   

## 2021-02-15 ENCOUNTER — Ambulatory Visit: Payer: Medicare Other

## 2021-02-17 ENCOUNTER — Ambulatory Visit: Payer: Medicare Other

## 2021-02-17 ENCOUNTER — Telehealth: Payer: Self-pay

## 2021-02-17 NOTE — Telephone Encounter (Signed)
No show. Called patient and left a message pertaining to appointment and a reminder for the next follow up session. Return phone call requested. Phone number ([phone]) provided.   

## 2021-02-22 ENCOUNTER — Ambulatory Visit: Payer: Medicare Other

## 2021-02-24 ENCOUNTER — Ambulatory Visit: Payer: Medicare Other

## 2021-03-03 ENCOUNTER — Ambulatory Visit: Payer: Medicare Other | Attending: Physician Assistant

## 2021-03-03 DIAGNOSIS — M5417 Radiculopathy, lumbosacral region: Secondary | ICD-10-CM

## 2021-03-03 DIAGNOSIS — M545 Low back pain, unspecified: Secondary | ICD-10-CM

## 2021-03-03 DIAGNOSIS — M6281 Muscle weakness (generalized): Secondary | ICD-10-CM

## 2021-03-03 NOTE — Therapy (Signed)
[facility]TS MEDICINE [address] [place], [address] Phone: [phone]   Fax:  [phone]  Physical Therapy  Discharge Summary  Patient Details  Name: Mandy Miller MRN: [id number] Date of Birth: 11-20-1955 Referring Provider (PT): Christen Bame, [place]   Encounter Date: 03/03/2021    Past Medical History:  Diagnosis Date  . Anxiety   . Depression     Past Surgical History:  Procedure Laterality Date  . ABDOMINAL HYSTERECTOMY  1995   partial    There were no vitals filed for this visit.                                PT Short Term Goals - 03/03/21 1606      PT SHORT TERM GOAL #1   Title Patient will be independent with her initial HEP to decrease pain, improve strength and ability to perform chores and bed mobility more comfortably.    Baseline Pt has started her HEP. (01/26/2021)    Time 3    Period Weeks    Status Unable to assess    Target Date 01/28/21             PT Long Term Goals - 03/03/21 1606      PT LONG TERM GOAL #1   Title Pt will have a decrease in low back pain to 4/10 or less at most to promote ability to perform chores and bed mobility more comfortably.    Baseline 9/10 low back pain at most for the past month (01/26/2021)    Time 8    Period Weeks    Status Unable to assess    Target Date 03/25/21      PT LONG TERM GOAL #2   Title Pt will improve bilatearl hip extension and abduction strength by at least 1/2 MMT grade to promote ability to vacuum, clean, perform work duties more comfortably.    Baseline Hip extension 4-/5 R and L, hip abduction 4-/5 R, 4/5 L (01/26/2021)    Time 8    Period Weeks    Status Unable to assess    Target Date 03/25/21      PT LONG TERM GOAL #3   Title Pt will improve FOTO score by at least 10 points as a demonstration of improved function.    Baseline Lumbar FOTO 47 (01/26/2021)    Time 8    Period Weeks    Status Unable  to assess    Target Date 03/25/21                 Plan - 03/03/21 1607    Clinical Impression Statement Skilled therapy services discharged secondary to pt not attending her follow up visits.    Personal Factors and Comorbidities Comorbidity 2    Comorbidities Anxiety, depression    Examination-Activity Limitations Bed Mobility;Bend;Reach Overhead    PT Treatment/Interventions Therapeutic exercise    PT Next Visit Plan posture, trunk, hip strengthening, manual techniques, modalities PRN    Consulted and Agree with Plan of Care Patient           Patient will benefit from skilled therapeutic intervention in order to improve the following deficits and impairments:  Pain,Postural dysfunction,Improper body mechanics,Decreased strength  Visit Diagnosis: Acute right-sided low back pain, unspecified whether sciatica present  Radiculopathy, lumbosacral region  Muscle weakness (generalized)     Problem List Patient Active Problem  List   Diagnosis Date Noted  . Chronic shoulder pain (Left) 12/17/2019  . Chronic wrist pain (Left) 12/17/2019  . Neurogenic pain 12/17/2019  . Cervicalgia 12/17/2019  . Chronic pain syndrome 12/05/2019  . Pharmacologic therapy 12/05/2019  . Disorder of skeletal system 12/05/2019  . Problems influencing health status 12/05/2019  . Abnormal hepatitis serology 10/09/2018  . Diverticulitis 07/13/2018  . Essential hypertension 07/13/2018  . Obesity (BMI 30.0-34.9) 07/04/2018  . Fatigue 10/16/2017  . Obstructive sleep apnea 05/23/2014  . Trigger finger, acquired 03/11/2014  . Depression 05/03/2013    Thank you for your referral.  Loralyn Freshwater PT, DPT   03/03/2021, 4:08 PM  [facility] PHYSICAL AND SPORTS MEDICINE [address], [place], [address] Phone: [phone]   Fax:  [phone]  Name: Mandy Miller MRN: [id number] Date of Birth: 1956/07/28

## 2021-03-08 ENCOUNTER — Ambulatory Visit: Payer: Medicare Other

## 2021-03-10 ENCOUNTER — Ambulatory Visit: Payer: Medicare Other

## 2021-04-01 ENCOUNTER — Other Ambulatory Visit (HOSPITAL_COMMUNITY): Payer: Self-pay | Admitting: Adult Health

## 2021-11-05 ENCOUNTER — Encounter: Payer: Self-pay | Admitting: *Deleted

## 2021-11-11 ENCOUNTER — Inpatient Hospital Stay: Payer: Medicare Other

## 2021-11-11 ENCOUNTER — Other Ambulatory Visit: Payer: Self-pay

## 2021-11-11 ENCOUNTER — Encounter: Payer: Self-pay | Admitting: Oncology

## 2021-11-11 ENCOUNTER — Inpatient Hospital Stay: Payer: Medicare Other | Attending: Oncology | Admitting: Oncology

## 2021-11-11 VITALS — BP 131/84 | HR 80 | Temp 98.5°F | Resp 18 | Wt 153.6 lb

## 2021-11-11 DIAGNOSIS — F32A Depression, unspecified: Secondary | ICD-10-CM | POA: Diagnosis not present

## 2021-11-11 DIAGNOSIS — R161 Splenomegaly, not elsewhere classified: Secondary | ICD-10-CM

## 2021-11-11 DIAGNOSIS — F419 Anxiety disorder, unspecified: Secondary | ICD-10-CM | POA: Insufficient documentation

## 2021-11-11 DIAGNOSIS — R112 Nausea with vomiting, unspecified: Secondary | ICD-10-CM

## 2021-11-11 DIAGNOSIS — R1032 Left lower quadrant pain: Secondary | ICD-10-CM | POA: Insufficient documentation

## 2021-11-11 DIAGNOSIS — Z79899 Other long term (current) drug therapy: Secondary | ICD-10-CM | POA: Insufficient documentation

## 2021-11-11 DIAGNOSIS — R7989 Other specified abnormal findings of blood chemistry: Secondary | ICD-10-CM | POA: Diagnosis not present

## 2021-11-11 DIAGNOSIS — R768 Other specified abnormal immunological findings in serum: Secondary | ICD-10-CM

## 2021-11-11 LAB — CBC WITH DIFFERENTIAL/PLATELET
Abs Immature Granulocytes: 0.03 10*3/uL (ref 0.00–0.07)
Basophils Absolute: 0 10*3/uL (ref 0.0–0.1)
Basophils Relative: 0 %
Eosinophils Absolute: 0.2 10*3/uL (ref 0.0–0.5)
Eosinophils Relative: 2 %
HCT: 37 % (ref 36.0–46.0)
Hemoglobin: 12.5 g/dL (ref 12.0–15.0)
Immature Granulocytes: 0 %
Lymphocytes Relative: 26 %
Lymphs Abs: 2.2 10*3/uL (ref 0.7–4.0)
MCH: 28.9 pg (ref 26.0–34.0)
MCHC: 33.8 g/dL (ref 30.0–36.0)
MCV: 85.6 fL (ref 80.0–100.0)
Monocytes Absolute: 0.5 10*3/uL (ref 0.1–1.0)
Monocytes Relative: 7 %
Neutro Abs: 5.4 10*3/uL (ref 1.7–7.7)
Neutrophils Relative %: 65 %
Platelets: 244 10*3/uL (ref 150–400)
RBC: 4.32 MIL/uL (ref 3.87–5.11)
RDW: 11.9 % (ref 11.5–15.5)
WBC: 8.3 10*3/uL (ref 4.0–10.5)
nRBC: 0 % (ref 0.0–0.2)

## 2021-11-11 LAB — HIV ANTIBODY (ROUTINE TESTING W REFLEX): HIV Screen 4th Generation wRfx: NONREACTIVE

## 2021-11-11 LAB — TECHNOLOGIST SMEAR REVIEW: Plt Morphology: ADEQUATE

## 2021-11-11 LAB — LACTATE DEHYDROGENASE: LDH: 167 U/L (ref 98–192)

## 2021-11-11 NOTE — Progress Notes (Signed)
Hematology/Oncology Consult note [phone] [phone]         Patient Care Team: Ziglar, Lincoln Brigham, MD as PCP - General (Family Medicine) Earlie Server, MD as Consulting Physician (Hematology and Oncology)  REFERRING PROVIDER: Ziglar, Gar Ponto, NP  CHIEF COMPLAINTS/REASON FOR VISIT:  Evaluation of splenomegaly.    HISTORY OF PRESENTING ILLNESS:   Mandy Miller is a  66 y.o.  female with PMH listed below was seen in consultation at the request of  Ziglar, Gar Ponto, NP  for evaluation of splenomegaly.   Patient has diverticulosis and has had multiple CT abdomen done in the past. Splenomegaly was noted.  spleen size 10/25/2021  14.6cm 02/02/2020  14.5cm 07/11/19 14.2cm  09/18/2018, 07/11/18, 03/23/2015 spleen size was noted to be normal.  Patient reports not feeling well recently.  Early 2022 He mother had COVID 19 pneumonia, her brother also was infected. She had mild symptoms.  About 1-2 weeks after that, she starts to feel " not right". + nausea, no vomiting, decreased appetite  + left lower quadrant pain.    Review of Systems  Constitutional:  Positive for appetite change. Negative for chills, fatigue, fever and unexpected weight change.  HENT:   Negative for hearing loss and voice change.   Eyes:  Negative for eye problems.  Respiratory:  Negative for chest tightness and cough.   Cardiovascular:  Negative for chest pain.  Gastrointestinal:  Positive for abdominal pain and nausea. Negative for abdominal distention and blood in stool.  Endocrine: Negative for hot flashes.  Genitourinary:  Negative for difficulty urinating and frequency.   Musculoskeletal:  Negative for arthralgias.  Skin:  Negative for itching and rash.  Neurological:  Negative for extremity weakness.  Hematological:  Negative for adenopathy.  Psychiatric/Behavioral:  Negative for confusion.    MEDICAL HISTORY:  Past Medical History:  Diagnosis Date   Anxiety    Depression     Splenomegaly     SURGICAL HISTORY: Past Surgical History:  Procedure Laterality Date   ABDOMINAL HYSTERECTOMY  1995   partial    SOCIAL HISTORY: Social History   Socioeconomic History   Marital status: Widowed    Spouse name: Not on file   Number of children: Not on file   Years of education: Not on file   Highest education level: Not on file  Occupational History   Not on file  Tobacco Use   Smoking status: Never   Smokeless tobacco: Never  Vaping Use   Vaping Use: Never used  Substance and Sexual Activity   Alcohol use: Yes    Comment: occassional    Drug use: Never   Sexual activity: Not on file  Other Topics Concern   Not on file  Social History Narrative   Not on file   Social Determinants of Health   Financial Resource Strain: Not on file  Food Insecurity: Not on file  Transportation Needs: Not on file  Physical Activity: Not on file  Stress: Not on file  Social Connections: Not on file  Intimate Partner Violence: Not on file    FAMILY HISTORY: Family History  Problem Relation Age of Onset   Breast cancer Mother    Heart disease Mother    Hypertension Father    Diabetes Father     ALLERGIES:  has No Known Allergies.  MEDICATIONS:  Current Outpatient Medications  Medication Sig Dispense Refill   sertraline (ZOLOFT) 100 MG tablet Take 100 mg by mouth daily.     traZODone (  DESYREL) 100 MG tablet Take 100 mg by mouth at bedtime.     methocarbamol (ROBAXIN) 500 MG tablet Take 500 mg by mouth 4 (four) times daily.     No current facility-administered medications for this visit.     PHYSICAL EXAMINATION: ECOG PERFORMANCE STATUS: 1 - Symptomatic but completely ambulatory Vitals:   11/11/21 1509  BP: 131/84  Pulse: 80  Resp: 18  Temp: 98.5 F (36.9 C)   Filed Weights   11/11/21 1509  Weight: 153 lb 9.6 oz (69.7 kg)    Physical Exam Constitutional:      General: She is not in acute distress. HENT:     Head: Normocephalic and  atraumatic.  Eyes:     General: No scleral icterus. Cardiovascular:     Rate and Rhythm: Normal rate and regular rhythm.     Heart sounds: Normal heart sounds.  Pulmonary:     Effort: Pulmonary effort is normal. No respiratory distress.     Breath sounds: No wheezing.  Abdominal:     General: Bowel sounds are normal. There is no distension.     Palpations: Abdomen is soft.     Comments: Spleen is not palpable.   Musculoskeletal:        General: No deformity. Normal range of motion.     Cervical back: Normal range of motion and neck supple.  Skin:    General: Skin is warm and dry.     Findings: No erythema or rash.  Neurological:     Mental Status: She is alert and oriented to person, place, and time. Mental status is at baseline.     Cranial Nerves: No cranial nerve deficit.     Coordination: Coordination normal.  Psychiatric:        Mood and Affect: Mood normal.    LABORATORY DATA:  I have reviewed the data as listed Lab Results  Component Value Date   WBC 8.3 11/11/2021   HGB 12.5 11/11/2021   HCT 37.0 11/11/2021   MCV 85.6 11/11/2021   PLT 244 11/11/2021   No results for input(s): NA, K, CL, CO2, GLUCOSE, BUN, CREATININE, CALCIUM, GFRNONAA, GFRAA, PROT, ALBUMIN, AST, ALT, ALKPHOS, BILITOT, BILIDIR, IBILI in the last 8760 hours. Iron/TIBC/Ferritin/ %Sat No results found for: IRON, TIBC, FERRITIN, IRONPCTSAT    RADIOGRAPHIC STUDIES: I have personally reviewed the radiological images as listed and agreed with the findings in the report. No results found.    ASSESSMENT & PLAN:  1. Splenomegaly   2. Nausea and vomiting, unspecified vomiting type   3. Abnormal hepatitis serology    # Splenomegaly, chronic, gradual increase in size.  Unclear if her current symptoms- nausea, decreased appetite is related to splenomegaly or recent URI.  Check cbc, cmp, smear, EBV, HIV, hepatitis, LDH, ANA, SPEP, light chain, JAK2 mutation with reflex, BCR-ABL1 Further management  depending on above blood work.   Abnormal hepatitis serology, refer to GI.   Orders Placed This Encounter  Procedures   Technologist smear review    Standing Status:   Future    Number of Occurrences:   1    Standing Expiration Date:   11/11/2022   CBC with Differential/Platelet    Standing Status:   Future    Number of Occurrences:   1    Standing Expiration Date:   11/11/2022   Multiple Myeloma Panel (SPEP&IFE w/QIG)    Standing Status:   Future    Number of Occurrences:   1    Standing Expiration  Date:   11/11/2022   Kappa/lambda light chains    Standing Status:   Future    Number of Occurrences:   1    Standing Expiration Date:   11/11/2022   Flow cytometry panel-leukemia/lymphoma work-up    Standing Status:   Future    Number of Occurrences:   1    Standing Expiration Date:   11/11/2022   ANA, IFA (with reflex)    Standing Status:   Future    Number of Occurrences:   1    Standing Expiration Date:   11/11/2022   HIV Antibody (routine testing w rflx)    Standing Status:   Future    Number of Occurrences:   1    Standing Expiration Date:   11/11/2022   Lactate dehydrogenase    Standing Status:   Future    Number of Occurrences:   1    Standing Expiration Date:   11/11/2022   Hepatitis panel, acute    Standing Status:   Future    Number of Occurrences:   1    Standing Expiration Date:   11/11/2022   BCR-ABL1 FISH    Standing Status:   Future    Number of Occurrences:   1    Standing Expiration Date:   11/11/2022   JAK2 V617F, w Reflex to CALR/E12/MPL    Standing Status:   Future    Number of Occurrences:   1    Standing Expiration Date:   11/11/2022   Epstein barr vrs(ebv dna by pcr)    Standing Status:   Future    Number of Occurrences:   1    Standing Expiration Date:   11/11/2022   EBV ab to viral capsid ag pnl, IgG+IgM    Standing Status:   Future    Number of Occurrences:   1    Standing Expiration Date:   11/11/2022    All questions were answered. The patient knows to call  the clinic with any problems questions or concerns.  cc Ziglar, Gar Ponto, NP   Thank you for this kind referral and the opportunity to participate in the care of this patient. A copy of today's note is routed to referring provider   Earlie Server, MD, PhD [facility] Hematology Oncology 11/11/2021

## 2021-11-12 LAB — COMP PANEL: LEUKEMIA/LYMPHOMA

## 2021-11-12 LAB — EBV AB TO VIRAL CAPSID AG PNL, IGG+IGM
EBV VCA IgG: >600 U/mL — ABNORMAL HIGH (ref 0.0–17.9)
EBV VCA IgM: <36 U/mL (ref 0.0–35.9)

## 2021-11-12 LAB — HEPATITIS PANEL, ACUTE
HCV Ab: REACTIVE — AB
Hep A IgM: NONREACTIVE
Hep B C IgM: REACTIVE — AB
Hepatitis B Surface Ag: NONREACTIVE

## 2021-11-12 LAB — KAPPA/LAMBDA LIGHT CHAINS
Kappa free light chain: 15.1 mg/L (ref 3.3–19.4)
Kappa, lambda light chain ratio: 1.25 (ref 0.26–1.65)
Lambda free light chains: 12.1 mg/L (ref 5.7–26.3)

## 2021-11-15 ENCOUNTER — Other Ambulatory Visit: Payer: Self-pay | Admitting: Oncology

## 2021-11-15 DIAGNOSIS — R768 Other specified abnormal immunological findings in serum: Secondary | ICD-10-CM

## 2021-11-15 LAB — BCR-ABL1 FISH
Cells Analyzed: 200
Cells Counted: 200

## 2021-11-15 LAB — EPSTEIN BARR VRS(EBV DNA BY PCR): EBV DNA QN by PCR: NEGATIVE IU/mL

## 2021-11-16 ENCOUNTER — Telehealth: Payer: Self-pay

## 2021-11-16 DIAGNOSIS — R768 Other specified abnormal immunological findings in serum: Secondary | ICD-10-CM

## 2021-11-16 LAB — MULTIPLE MYELOMA PANEL, SERUM
Albumin SerPl Elph-Mcnc: 4.2 g/dL (ref 2.9–4.4)
Albumin/Glob SerPl: 1.5 (ref 0.7–1.7)
Alpha 1: 0.2 g/dL (ref 0.0–0.4)
Alpha2 Glob SerPl Elph-Mcnc: 0.6 g/dL (ref 0.4–1.0)
B-Globulin SerPl Elph-Mcnc: 1.1 g/dL (ref 0.7–1.3)
Gamma Glob SerPl Elph-Mcnc: 1.1 g/dL (ref 0.4–1.8)
Globulin, Total: 2.9 g/dL (ref 2.2–3.9)
IgA: 114 mg/dL (ref 87–352)
IgG (Immunoglobin G), Serum: 1095 mg/dL (ref 586–1602)
IgM (Immunoglobulin M), Srm: 65 mg/dL (ref 26–217)
Total Protein ELP: 7.1 g/dL (ref 6.0–8.5)

## 2021-11-16 LAB — ANTINUCLEAR ANTIBODIES, IFA: ANA Ab, IFA: NEGATIVE

## 2021-11-16 NOTE — Telephone Encounter (Signed)
-----   Message from Rickard Patience, MD sent at 11/15/2021 10:47 PM EST ----- Please arrange patient to check additional labs- refer to GI for evaluation- abnormal hepatitis serology.  Thanks.

## 2021-11-16 NOTE — Telephone Encounter (Signed)
Mandy Miller, please schedule patient to get additional labs drawn Please notify patient of appointments. Tnaks  GI referral placed.

## 2021-11-16 NOTE — Telephone Encounter (Signed)
Left VM for pt to return call to schedule lab appt.

## 2021-11-17 ENCOUNTER — Telehealth: Payer: Self-pay | Admitting: *Deleted

## 2021-11-17 NOTE — Telephone Encounter (Signed)
Patient reports that she received a call to schedule blood transfusion.

## 2021-11-17 NOTE — Telephone Encounter (Signed)
Spoke to pt, she is scheduled for additional labs that was requester by Dr.Yu.

## 2021-11-19 ENCOUNTER — Inpatient Hospital Stay: Payer: Medicare Other

## 2021-11-19 ENCOUNTER — Other Ambulatory Visit: Payer: Self-pay

## 2021-11-19 DIAGNOSIS — R768 Other specified abnormal immunological findings in serum: Secondary | ICD-10-CM

## 2021-11-19 DIAGNOSIS — R161 Splenomegaly, not elsewhere classified: Secondary | ICD-10-CM | POA: Diagnosis not present

## 2021-11-22 LAB — HCV RNA BY PCR, QN RFX GENO
HCV RNA Qnt(log copy/mL): UNDETERMINED log10 IU/mL
HepC Qn: NOT DETECTED IU/mL

## 2021-11-23 LAB — CALR + JAK2 E12-15 + MPL (REFLEXED)

## 2021-11-23 LAB — JAK2 V617F, W REFLEX TO CALR/E12/MPL

## 2021-12-02 ENCOUNTER — Other Ambulatory Visit: Payer: Self-pay

## 2021-12-02 ENCOUNTER — Inpatient Hospital Stay: Payer: Medicare Other | Attending: Oncology | Admitting: Oncology

## 2021-12-02 ENCOUNTER — Inpatient Hospital Stay: Payer: Medicare Other

## 2021-12-02 ENCOUNTER — Encounter: Payer: Self-pay | Admitting: Oncology

## 2021-12-02 VITALS — BP 145/90 | HR 74 | Temp 97.0°F | Resp 18 | Wt 154.1 lb

## 2021-12-02 DIAGNOSIS — R161 Splenomegaly, not elsewhere classified: Secondary | ICD-10-CM | POA: Insufficient documentation

## 2021-12-02 DIAGNOSIS — R1032 Left lower quadrant pain: Secondary | ICD-10-CM | POA: Insufficient documentation

## 2021-12-02 DIAGNOSIS — R768 Other specified abnormal immunological findings in serum: Secondary | ICD-10-CM

## 2021-12-02 DIAGNOSIS — R1012 Left upper quadrant pain: Secondary | ICD-10-CM | POA: Insufficient documentation

## 2021-12-02 LAB — HEPATIC FUNCTION PANEL
ALT: 21 U/L (ref 0–44)
AST: 19 U/L (ref 15–41)
Albumin: 4.3 g/dL (ref 3.5–5.0)
Alkaline Phosphatase: 56 U/L (ref 38–126)
Bilirubin, Direct: 0.1 mg/dL (ref 0.0–0.2)
Indirect Bilirubin: 0.3 mg/dL (ref 0.3–0.9)
Total Bilirubin: 0.4 mg/dL (ref 0.3–1.2)
Total Protein: 7.5 g/dL (ref 6.5–8.1)

## 2021-12-02 NOTE — Progress Notes (Signed)
Hematology/Oncology progress note [phone] [phone]         Patient Care Team: Ziglar, Eli Phillips, MD as PCP - General (Family Medicine) Rickard Patience, MD as Consulting Physician (Hematology and Oncology)  REFERRING PROVIDER: Girtha Rm Eli Phillips, MD  CHIEF COMPLAINTS/REASON FOR VISIT:  splenomegaly.    HISTORY OF PRESENTING ILLNESS:   Mandy Miller is a  66 y.o.  female with PMH listed below was seen in consultation at the request of  Ziglar, Eli Phillips, MD  for evaluation of splenomegaly.   Patient has diverticulosis and has had multiple CT abdomen done in the past. Splenomegaly was noted.  spleen size 10/25/2021  14.6cm 02/02/2020  14.5cm 07/11/19 14.2cm  09/18/2018, 07/11/18, 03/23/2015 spleen size was noted to be normal.  Patient reports not feeling well recently.  Early 2022 He mother had COVID 19 pneumonia, her brother also was infected. She had mild symptoms.  About 1-2 weeks after that, she starts to feel " not right". + nausea, no vomiting, decreased appetite  + left lower quadrant pain.    INTERVAL HISTORY Mandy Miller is a 66 y.o. female who has above history reviewed by me today presents for follow up visit to review blood work. She was accompanied by her daughter. She had a blood work done during interval. Nausea, decreased appetite has improved.  She has gained some weight since last visit.  She continues to have intermittent left upper quadrant and left lower quadrant pain.    Review of Systems  Constitutional:  Negative for appetite change, chills, fatigue, fever and unexpected weight change.  HENT:   Negative for hearing loss and voice change.   Eyes:  Negative for eye problems.  Respiratory:  Negative for chest tightness and cough.   Cardiovascular:  Negative for chest pain.  Gastrointestinal:  Positive for abdominal pain. Negative for abdominal distention, blood in stool and nausea.  Endocrine: Negative for hot flashes.  Genitourinary:   Negative for difficulty urinating and frequency.   Musculoskeletal:  Negative for arthralgias.  Skin:  Negative for itching and rash.  Neurological:  Negative for extremity weakness.  Hematological:  Negative for adenopathy.  Psychiatric/Behavioral:  Negative for confusion.    MEDICAL HISTORY:  Past Medical History:  Diagnosis Date   Anxiety    Depression    Splenomegaly     SURGICAL HISTORY: Past Surgical History:  Procedure Laterality Date   ABDOMINAL HYSTERECTOMY  1995   partial    SOCIAL HISTORY: Social History   Socioeconomic History   Marital status: Widowed    Spouse name: Not on file   Number of children: Not on file   Years of education: Not on file   Highest education level: Not on file  Occupational History   Not on file  Tobacco Use   Smoking status: Never   Smokeless tobacco: Never  Vaping Use   Vaping Use: Never used  Substance and Sexual Activity   Alcohol use: Yes    Comment: occassional    Drug use: Never   Sexual activity: Not on file  Other Topics Concern   Not on file  Social History Narrative   Not on file   Social Determinants of Health   Financial Resource Strain: Not on file  Food Insecurity: Not on file  Transportation Needs: Not on file  Physical Activity: Not on file  Stress: Not on file  Social Connections: Not on file  Intimate Partner Violence: Not on file    FAMILY HISTORY: Family History  Problem Relation Age of Onset   Breast cancer Mother    Heart disease Mother    Hypertension Father    Diabetes Father     ALLERGIES:  has No Known Allergies.  MEDICATIONS:  Current Outpatient Medications  Medication Sig Dispense Refill   sertraline (ZOLOFT) 100 MG tablet Take 100 mg by mouth daily.     traZODone (DESYREL) 100 MG tablet Take 100 mg by mouth at bedtime.     methocarbamol (ROBAXIN) 500 MG tablet Take 500 mg by mouth 4 (four) times daily. (Patient not taking: Reported on 12/02/2021)     No current  facility-administered medications for this visit.     PHYSICAL EXAMINATION: ECOG PERFORMANCE STATUS: 1 - Symptomatic but completely ambulatory Vitals:   12/02/21 1432  BP: (!) 145/90  Pulse: 74  Resp: 18  Temp: (!) 97 F (36.1 C)   Filed Weights   12/02/21 1432  Weight: 154 lb 1.6 oz (69.9 kg)    Physical Exam Constitutional:      General: She is not in acute distress. HENT:     Head: Normocephalic and atraumatic.  Eyes:     General: No scleral icterus. Cardiovascular:     Rate and Rhythm: Normal rate and regular rhythm.     Heart sounds: Normal heart sounds.  Pulmonary:     Effort: Pulmonary effort is normal. No respiratory distress.     Breath sounds: No wheezing.  Abdominal:     General: Bowel sounds are normal. There is no distension.     Palpations: Abdomen is soft.     Comments: Spleen is not palpable.   Musculoskeletal:        General: No deformity. Normal range of motion.     Cervical back: Normal range of motion and neck supple.  Skin:    General: Skin is warm and dry.     Findings: No erythema or rash.  Neurological:     Mental Status: She is alert and oriented to person, place, and time. Mental status is at baseline.     Cranial Nerves: No cranial nerve deficit.     Coordination: Coordination normal.  Psychiatric:        Mood and Affect: Mood normal.    LABORATORY DATA:  I have reviewed the data as listed Lab Results  Component Value Date   WBC 8.3 11/11/2021   HGB 12.5 11/11/2021   HCT 37.0 11/11/2021   MCV 85.6 11/11/2021   PLT 244 11/11/2021   Recent Labs    12/02/21 1519  PROT 7.5  ALBUMIN 4.3  AST 19  ALT 21  ALKPHOS 56  BILITOT 0.4  BILIDIR 0.1  IBILI 0.3   Iron/TIBC/Ferritin/ %Sat No results found for: IRON, TIBC, FERRITIN, IRONPCTSAT    RADIOGRAPHIC STUDIES: I have personally reviewed the radiological images as listed and agreed with the findings in the report. No results found.    ASSESSMENT & PLAN:  1.  Splenomegaly   2. Abnormal hepatitis serology    # Splenomegaly, chronic, gradual small increase in size.  Labs reviewed and discussed with patient -Negative EBV IgM, positive IgG level.  Negative EBV virus PCR. -Likely previous exposure to EBV which may cause chronic splenomegaly.  Negative HIV.  -JAK2 V617F mutation negative, with reflex to other mutations CALR, MPL, JAK 2 Ex 12-15 mutations negative. BCR ABL 1 negative-less likely myeloproliferative disease. -ANA is negative.  Normal LDH.  SPEP negative M protein.  Normal light chain ratio. Flowcytometry negative.  I had  a lengthy discussion with patient and daughter.  It is reassuring that her acute nausea/weight loss/decreased appetite symptoms have improved.  Clinically, she may have had an acute virus infection.  Splenomegaly has been a chronic condition for her for many years.  Possibly secondary to previous EBV infection. Rare etiology includes spleen lymphoma.  Consider PET scan in the future if clinically strongly indicating underlying lymphoma /constitutional symptoms, and other work-up being negative.  For now her symptom has improved.  I recommend observation.   #Abnormal hepatitis serology.  HCV antibody positive.  HCVRNA by PCR negative. Patient has positive HBV core IgM-indicating recent exposure.  Patient reports a history of previous hepatitis B infection. Check LFT.  Check hepatitis B virus DNA quantification.  Appreciate GI input.   Orders Placed This Encounter  Procedures   Hepatic function panel    Standing Status:   Future    Number of Occurrences:   1    Standing Expiration Date:   12/02/2022   Hepatitis B DNA, Ultraquantitative, PCR    Standing Status:   Future    Number of Occurrences:   1    Standing Expiration Date:   12/03/2022   CBC with Differential/Platelet    Standing Status:   Future    Standing Expiration Date:   12/03/2022   Comprehensive metabolic panel    Standing Status:   Future    Standing  Expiration Date:   12/03/2022   Lactate dehydrogenase    Standing Status:   Future    Standing Expiration Date:   12/03/2022    All questions were answered. The patient knows to call the clinic with any problems questions or concerns.  cc Ziglar, Eli Phillips, MD   Thank you for this kind referral and the opportunity to participate in the care of this patient. A copy of today's note is routed to referring provider   Rickard Patience, MD, PhD [facility] Hematology Oncology 12/02/2021

## 2021-12-02 NOTE — Progress Notes (Signed)
Pt here for follow up. Pt reports that she has intermittent pain to left ribs.  ?

## 2021-12-03 LAB — HEPATITIS B DNA, ULTRAQUANTITATIVE, PCR
HBV DNA SERPL PCR-ACNC: NOT DETECTED IU/mL
HBV DNA SERPL PCR-LOG IU: UNDETERMINED log10 IU/mL

## 2021-12-07 ENCOUNTER — Telehealth: Payer: Self-pay

## 2021-12-07 NOTE — Telephone Encounter (Signed)
Called to inform patient of her lab results pertaining to her HBV PCR being negative. Patient did not answer. Left detailed message in regards to lab results. Advised to call our office with any questions or concerns.  ?

## 2021-12-07 NOTE — Telephone Encounter (Signed)
-----   Message from Rickard Patience, MD sent at 12/05/2021  4:02 PM EST ----- ?Let her know that blood work for HBV PCR copies were negative. Previous positive testing is possibly false positive.  ?

## 2022-01-04 ENCOUNTER — Other Ambulatory Visit: Payer: Self-pay | Admitting: Family Medicine

## 2022-01-04 DIAGNOSIS — Z1231 Encounter for screening mammogram for malignant neoplasm of breast: Secondary | ICD-10-CM

## 2022-02-01 ENCOUNTER — Encounter: Payer: Self-pay | Admitting: Gastroenterology

## 2022-02-01 ENCOUNTER — Ambulatory Visit: Payer: Medicare Other | Admitting: Gastroenterology

## 2022-02-01 VITALS — BP 116/71 | HR 71 | Temp 98.7°F | Ht 63.0 in | Wt 156.8 lb

## 2022-02-01 DIAGNOSIS — R768 Other specified abnormal immunological findings in serum: Secondary | ICD-10-CM

## 2022-02-01 DIAGNOSIS — Z1211 Encounter for screening for malignant neoplasm of colon: Secondary | ICD-10-CM | POA: Diagnosis not present

## 2022-02-01 MED ORDER — SUTAB 1479-225-188 MG PO TABS
ORAL_TABLET | ORAL | 0 refills | Status: DC
Start: 2022-02-01 — End: 2024-03-11

## 2022-02-01 NOTE — Progress Notes (Signed)
?  ?Jonathon Bellows MD, MRCP(U.K) ?[address]  ?[address]  ?[place], [place] [address]  ?Main: [phone]  ?Fax: [phone] ? ? ?Gastroenterology Consultation ? ?Referring Provider:     Earlie Server, MD ?Primary Care Physician:  Magnus Ivan Lincoln Brigham, MD ?Primary Gastroenterologist:  Dr. Jonathon Bellows  ?Reason for Consultation:    Positive hepatitis serology  ?      ? HPI:   ?Mandy Miller is a 66 y.o. y/o female referred by Dr Tasia Catchings for abnormal hepatitis serology.  ? ?She has been referred for abnormal hepatitis serology.  Been seen by Dr. Tasia Catchings in oncology recently for splenomegaly as part of the work-up had hepatitis serology labs checked. ? ?11/11/2021 hepatitis C virus antibody positive hepatitis B core IgM reactive hepatitis A IgM nonreactive.  HIV antibody negative.  Hepatitis C virus not detected hepatitis B DNA not detected.  Hepatic function panel normal. ? ? ?09/11/2018: Hepatitis B surface antibody borderline positive hepatitis B surface antigen negative hepatitis C antibody reactive hepatitis C RNA not detected. ? ?10/25/2021: CT abdomen and pelvis with IV contrast splenomegaly. ? ?She recalls that many years back when she was pregnant was told that she had hepatitis.  Was not told clearly which type.  She denies being treated in the past for hepatitis.  Denies any illegal drug use.  She had a partner who used illegal drugs.  Family history of alcoholic liver disease.  She used to drink heavily for about a year some years back but she stopped.  No blood transfusions recently but had 130 years back during hysterectomy.  No tattoos.  She has had a colonoscopy over 7 to 8 years back.  Cannot recall.  Some sensation of incomplete defecation.  No family history of colon cancer or polyps. ? ? ?Past Medical History:  ?Diagnosis Date  ? Anxiety   ? Depression   ? Splenomegaly   ? ? ?Past Surgical History:  ?Procedure Laterality Date  ? ABDOMINAL HYSTERECTOMY  1995  ? partial  ? ? ?Prior to Admission medications   ?Medication  Sig Start Date End Date Taking? Authorizing Provider  ?methocarbamol (ROBAXIN) 500 MG tablet Take 500 mg by mouth 4 (four) times daily. ?Patient not taking: Reported on 12/02/2021    [provider]  ?sertraline (ZOLOFT) 100 MG tablet Take 100 mg by mouth daily.    [provider]  ?traZODone (DESYREL) 100 MG tablet Take 100 mg by mouth at bedtime.    [provider]  ? ? ?Family History  ?Problem Relation Age of Onset  ? Breast cancer Mother   ? Heart disease Mother   ? Hypertension Father   ? Diabetes Father   ?  ? ?Social History  ? ?Tobacco Use  ? Smoking status: Never  ? Smokeless tobacco: Never  ?Vaping Use  ? Vaping Use: Never used  ?Substance Use Topics  ? Alcohol use: Yes  ?  Comment: occassional   ? Drug use: Never  ? ? ?Allergies as of 02/01/2022  ? (No Known Allergies)  ? ? ?Review of Systems:    ?All systems reviewed and negative except where noted in HPI. ? ? Physical Exam:  ?BP 116/71   Pulse 71   Temp 98.7 ?F (37.1 ?C) (Oral)   Ht 5\' 3"  (1.6 m)   Wt 156 lb 12.8 oz (71.1 kg)   BMI 27.78 kg/m?  ?No LMP recorded. ?Psych:  Alert and cooperative. Normal mood and affect. ?General:   Alert,  Well-developed, well-nourished,  pleasant and cooperative in NAD ?Head:  Normocephalic and atraumatic. ?Eyes:  Sclera clear, no icterus.   Conjunctiva pink. ?Ears:  Normal auditory acuity. ?Neurologic:  Alert and oriented x3;  grossly normal neurologically. ?Psych:  Alert and cooperative. Normal mood and affect. ? ?Imaging Studies: ?No results found. ? ?Assessment and Plan:  ? ?Mandy Miller is a 66 y.o. y/o female has been referred for a positive hepatitis C antibody with negative viral load.  This was checked in March 2023.  She has had similar results back in 2019 available on epic.  Presently her hepatitis B core IgM is positive with a negative viral load.  This could be either a false positive or she could be in the window phase.  Her LFTs are normal hence I will repeat the hepatitis B  serology in 4 months time.  The hepatitis C antibody being positive with a negative viral load indicates she probably has had seroconversion with her immune system fighting the virus which can probably occur in under 5% of people infected with hepatitis C without treatment..  We will also set her up for screening colonoscopy. ? ?I have discussed alternative options, risks & benefits,  which include, but are not limited to, bleeding, infection, perforation,respiratory complication & drug reaction.  The patient agrees with this plan & written consent will be obtained.   ? ? ? ? ?Follow up in 5 months after labs  ? ?Dr Jonathon Bellows MD,MRCP(U.K) ? ?

## 2022-02-01 NOTE — Patient Instructions (Signed)
Please come back in 4 months to our lab for your blood drawn. ?

## 2022-02-11 ENCOUNTER — Ambulatory Visit
Admission: RE | Admit: 2022-02-11 | Discharge: 2022-02-11 | Disposition: A | Discharge status: Home / Self Care | Payer: Medicare Other | Source: Ambulatory Visit | Attending: Family Medicine | Admitting: Family Medicine

## 2022-02-11 ENCOUNTER — Encounter: Payer: Self-pay | Admitting: Radiology

## 2022-02-11 DIAGNOSIS — Z1231 Encounter for screening mammogram for malignant neoplasm of breast: Secondary | ICD-10-CM

## 2022-02-15 ENCOUNTER — Inpatient Hospital Stay
Admission: RE | Admit: 2022-02-15 | Discharge: 2022-02-15 | Disposition: A | Discharge status: Home / Self Care | Payer: Self-pay | Source: Ambulatory Visit | Attending: *Deleted | Admitting: *Deleted

## 2022-02-15 ENCOUNTER — Other Ambulatory Visit: Payer: Self-pay | Admitting: *Deleted

## 2022-02-15 DIAGNOSIS — Z1231 Encounter for screening mammogram for malignant neoplasm of breast: Secondary | ICD-10-CM

## 2022-06-07 ENCOUNTER — Ambulatory Visit: Payer: Medicare Other | Admitting: Oncology

## 2022-06-07 ENCOUNTER — Other Ambulatory Visit: Payer: Medicare Other

## 2022-06-14 ENCOUNTER — Inpatient Hospital Stay: Payer: Medicare Other | Attending: Oncology | Admitting: Oncology

## 2022-06-14 ENCOUNTER — Inpatient Hospital Stay: Payer: Medicare Other

## 2022-07-06 ENCOUNTER — Other Ambulatory Visit: Payer: Self-pay

## 2022-07-07 ENCOUNTER — Ambulatory Visit: Payer: Medicare Other | Admitting: Gastroenterology

## 2022-08-15 ENCOUNTER — Ambulatory Visit: Payer: Medicare Other | Admitting: Gastroenterology

## 2023-03-22 ENCOUNTER — Other Ambulatory Visit: Payer: Self-pay | Admitting: Family Medicine

## 2023-03-22 DIAGNOSIS — Z1231 Encounter for screening mammogram for malignant neoplasm of breast: Secondary | ICD-10-CM

## 2023-04-10 ENCOUNTER — Ambulatory Visit
Admission: RE | Admit: 2023-04-10 | Discharge: 2023-04-10 | Disposition: A | Discharge status: Home / Self Care | Payer: Medicare Other | Source: Ambulatory Visit | Attending: Family Medicine | Admitting: Family Medicine

## 2023-04-10 DIAGNOSIS — Z1231 Encounter for screening mammogram for malignant neoplasm of breast: Secondary | ICD-10-CM | POA: Diagnosis not present

## 2023-08-07 ENCOUNTER — Ambulatory Visit
Admission: EM | Admit: 2023-08-07 | Discharge: 2023-08-07 | Disposition: A | Discharge status: Home / Self Care | Payer: Medicare Other | Attending: Emergency Medicine | Admitting: Emergency Medicine

## 2023-08-07 DIAGNOSIS — R110 Nausea: Secondary | ICD-10-CM

## 2023-08-07 DIAGNOSIS — R1030 Lower abdominal pain, unspecified: Secondary | ICD-10-CM | POA: Diagnosis not present

## 2023-08-07 MED ORDER — OMEPRAZOLE 40 MG PO CPDR: 40.0000 mg | DELAYED_RELEASE_CAPSULE | Freq: Every day | ORAL | 0 refills | Status: AC

## 2023-08-07 MED ORDER — ONDANSETRON 8 MG PO TBDP
8.0000 mg | ORAL_TABLET | Freq: Three times a day (TID) | ORAL | 0 refills | Status: DC | PRN
Start: 2023-08-07 — End: 2024-08-23

## 2023-08-07 MED ORDER — METRONIDAZOLE 500 MG PO TABS: 500.0000 mg | ORAL_TABLET | Freq: Two times a day (BID) | ORAL | 0 refills | Status: DC

## 2023-08-07 NOTE — ED Provider Notes (Signed)
Mandy Miller    CSN: [id number] Arrival date & time: 08/07/23  1249      History   Chief Complaint Chief Complaint  Patient presents with   Abdominal Pain   Nausea    HPI Mandy Miller is a 67 y.o. female.   Patient presents for evaluation of intermittent lower abdominal pain described as sharp pains present for 1 month beginning after eating a fish sandwich with onions from a local [employer].  Associated nausea.  When symptoms initially began had 1 day of persistent vomiting which has now resolved.  Has been able to tolerate food and liquids.  Has had increased gas production primarily belching.  Has been having regular bowel movements, denying constipation and diarrhea.  Has been evaluated by an urgent care in the emergency department since symptoms began, endorses she was told was acid reflux and a viral illness, started on omeprazole and Zofran which has been somewhat helpful but symptoms have continued to persist.  Concerned with possible H. pylori, denies prior history.  Denies abdominal bloating, fever or URI symptoms.  Past Medical History:  Diagnosis Date   Anxiety    Depression    Splenomegaly     Patient Active Problem List   Diagnosis Date Noted   Splenomegaly 11/11/2021   Chronic shoulder pain (Left) 12/17/2019   Chronic wrist pain (Left) 12/17/2019   Neurogenic pain 12/17/2019   Cervicalgia 12/17/2019   Chronic pain syndrome 12/05/2019   Pharmacologic therapy 12/05/2019   Disorder of skeletal system 12/05/2019   Problems influencing health status 12/05/2019   Abnormal hepatitis serology 10/09/2018   Diverticulitis 07/13/2018   Essential hypertension 07/13/2018   Obesity (BMI 30.0-34.9) 07/04/2018   Fatigue 10/16/2017   Obstructive sleep apnea 05/23/2014   Trigger finger, acquired 03/11/2014   Depression 05/03/2013    Past Surgical History:  Procedure Laterality Date   ABDOMINAL HYSTERECTOMY  1995   partial    OB History   No obstetric  history on file.      Home Medications    Prior to Admission medications   Medication Sig Start Date End Date Taking? Authorizing Provider  metroNIDAZOLE (FLAGYL) 500 MG tablet Take 1 tablet (500 mg total) by mouth 2 (two) times daily. 08/07/23  Yes Wayne Wicklund, Elita Boone, NP  omeprazole (PRILOSEC) 40 MG capsule Take 1 capsule (40 mg total) by mouth daily. 08/07/23  Yes Morrissa Shein R, NP  ondansetron (ZOFRAN-ODT) 8 MG disintegrating tablet Take 1 tablet (8 mg total) by mouth every 8 (eight) hours as needed for nausea or vomiting. 08/07/23  Yes Jaquelinne Glendening R, NP  fluticasone (FLONASE) 50 MCG/ACT nasal spray Place 1 spray into both nostrils daily.    [provider]  sertraline (ZOLOFT) 100 MG tablet Take 100 mg by mouth daily.    [provider]  Sodium Sulfate-Mag Sulfate-KCl (SUTAB) [phone] MG TABS At 5 PM take 12 tablets using the 8 oz cup provided in the kit drinking 5 cups of water and 5 hours before your procedure repeat the same process. 02/01/22   Wyline Mood, MD  traZODone (DESYREL) 100 MG tablet Take 100 mg by mouth at bedtime.    [provider]    Family History Family History  Problem Relation Age of Onset   Breast cancer Mother    Heart disease Mother    Hypertension Father    Diabetes Father     Social History Social History   Tobacco Use   Smoking status: Never   Smokeless  tobacco: Never  Vaping Use   Vaping status: Never Used  Substance Use Topics   Alcohol use: Yes    Comment: occassional    Drug use: Never     Allergies   Patient has no known allergies.   Review of Systems Review of Systems   Physical Exam Triage Vital Signs ED Triage Vitals  Encounter Vitals Group     BP 08/07/23 1309 116/75     Systolic BP Percentile --      Diastolic BP Percentile --      Pulse Rate 08/07/23 1309 75     Resp 08/07/23 1309 17     Temp 08/07/23 1309 97.9 F (36.6 C)     Temp Source 08/07/23 1309 Oral     SpO2 08/07/23 1309  95 %     Weight --      Height --      Head Circumference --      Peak Flow --      Pain Score 08/07/23 1332 0     Pain Loc --      Pain Education --      Exclude from Growth Chart --    No data found.  Updated Vital Signs BP 116/75 (BP Location: Left Arm)   Pulse 75   Temp 97.9 F (36.6 C) (Oral)   Resp 17   SpO2 95%   Visual Acuity Right Eye Distance:   Left Eye Distance:   Bilateral Distance:    Right Eye Near:   Left Eye Near:    Bilateral Near:     Physical Exam Constitutional:      Appearance: Normal appearance. She is well-developed.  Eyes:     Extraocular Movements: Extraocular movements intact.  Pulmonary:     Effort: Pulmonary effort is normal.  Abdominal:     General: Abdomen is flat. Bowel sounds are increased.     Palpations: Abdomen is soft.     Tenderness: There is no abdominal tenderness.  Neurological:     Mental Status: She is alert and oriented to person, place, and time. Mental status is at baseline.      UC Treatments / Results  Labs (all labs ordered are listed, but only abnormal results are displayed) Labs Reviewed - No data to display  EKG   Radiology No results found.  Procedures Procedures (including critical care time)  Medications Ordered in UC Medications - No data to display  Initial Impression / Assessment and Plan / UC Course  I have reviewed the triage vital signs and the nursing notes.  Pertinent labs & imaging results that were available during my care of the patient were reviewed by me and considered in my medical decision making (see chart for details).  Lower abdominal pain, without vomiting  Vital signs are stable and while patient is visibly uncomfortable she is in no signs of distress nontoxic-appearing, increased bowel sounds noted on exam however there is no tenderness, stable for outpatient management, CT completed 3 days prior to emergency department, negative, reviewed findings, symptoms have been  present for 1 month without resolution will provide bacterial coverage, metronidazole sent to pharmacy, increase the dose of omeprazole and Zofran to provide additional comfort, recommended bland diet, recommended GI specialist, may follow-up with his urgent care as needed Final Clinical Impressions(s) / UC Diagnoses   Final diagnoses:  Lower abdominal pain  Nausea without vomiting     Discharge Instructions      Your evaluated for your abdominal  pain, unfortunately due to this being urgent care we are unable to test you for I H. pylori as we do not have that type of testing available  However symptoms have been present for about a month we will provide bacterial coverage, take metronidazole every morning and every evening for 7 days, avoid use of alcohol during treatment as this will make you feel sick  Continue to take omeprazole once daily, has increased dose from 20 mg to 40 in effort to reduce stomach acid and gas production  May use Zofran every 8 hours as needed for nausea, dose has been increased from 40 mg to 8 mg, placed under tongue and allow to dissolve  Continue to eat food as tolerated, try eating a bland diet with avoidance of spicy or greasy foods to prevent further stomach irritation  May attempt additional over-the-counter medicine such as simethicone, Tums, Maalox and Pepto-Bismol  If your symptoms continue to persist past use of medicine please follow-up with GI doctor    ED Prescriptions     Medication Sig Dispense Auth. Provider   omeprazole (PRILOSEC) 40 MG capsule Take 1 capsule (40 mg total) by mouth daily. 30 capsule Tonie Elsey R, NP   ondansetron (ZOFRAN-ODT) 8 MG disintegrating tablet Take 1 tablet (8 mg total) by mouth every 8 (eight) hours as needed for nausea or vomiting. 20 tablet Pellegrino Kennard R, NP   metroNIDAZOLE (FLAGYL) 500 MG tablet Take 1 tablet (500 mg total) by mouth 2 (two) times daily. 14 tablet Zerah Hilyer, Elita Boone, NP      PDMP not  reviewed this encounter.   Valinda Hoar, NP 08/07/23 1406

## 2023-08-07 NOTE — Discharge Instructions (Addendum)
Your evaluated for your abdominal pain, unfortunately due to this being urgent care we are unable to test you for I H. pylori as we do not have that type of testing available  However symptoms have been present for about a month we will provide bacterial coverage, take metronidazole every morning and every evening for 7 days, avoid use of alcohol during treatment as this will make you feel sick  Continue to take omeprazole once daily, has increased dose from 20 mg to 40 in effort to reduce stomach acid and gas production  May use Zofran every 8 hours as needed for nausea, dose has been increased from 40 mg to 8 mg, placed under tongue and allow to dissolve  Continue to eat food as tolerated, try eating a bland diet with avoidance of spicy or greasy foods to prevent further stomach irritation  May attempt additional over-the-counter medicine such as simethicone, Tums, Maalox and Pepto-Bismol  If your symptoms continue to persist past use of medicine please follow-up with GI doctor

## 2023-08-07 NOTE — ED Triage Notes (Signed)
Patient presents to [facility] for abdominal pain and nausea x 1 month.

## 2023-10-06 NOTE — Progress Notes (Signed)
 Sleep Medicine   Office Visit  Patient Name: Mandy Miller DOB: [birth year] MRN [id number]    Chief Complaint: OSA   Brief History:  Mandy Miller presents for an initial consult for sleep evaluation and to establish care. The patient has a longstanding history of sleep apnea and is currently on a CPAP. Prior to using a PAP, sleep quality was poor. This was noted every nights. The patient reported the following symptoms:  snoring, gasping, headaches, excessive fatigue and daytime sleepiness. The patient goes to sleep at 1100 pm and wakes up at 0730 am and will wake up in between to use the restroom. The patient relates anxiety as a history of psychiatric problems. The Epworth Sleepiness Score is 8 out of 24 . The patient relates  Cardiovascular risk factors include: hypertension. . The patient is currently on a CPAP@ 14 cmH2O. The patient reports using her PAP and feels rested after sleeping with PAP.  The patient reports benefiting from PAP use and would like for her to continue using PAP. Reported sleepiness is  improved. The compliance download shows  98% compliance with an average use time of 8 hours 25  minutes. The AHI is 1.9.  The patient continues to require PAP therapy as a medical necessity in order to eliminate her sleep apnea.   ROS  General: (-) fever, (-) chills, (-) night sweat Nose and Sinuses: (-) nasal stuffiness or itchiness, (-) postnasal drip, (-) nosebleeds, (-) sinus trouble. Mouth and Throat: (-) sore throat, (-) hoarseness. Neck: (-) swollen glands, (-) enlarged thyroid, (-) neck pain. Respiratory: - cough, - shortness of breath, - wheezing. Neurologic: - numbness, - tingling. Psychiatric: + anxiety, - depression Sleep behavior: -sleep paralysis -hypnogogic hallucinations -dream enactment      -vivid dreams -cataplexy -night terrors -sleep walking   Current Medication: Outpatient Encounter Medications as of 10/09/2023  Medication Sig   hydrochlorothiazide  (HYDRODIURIL ) 25  MG tablet Take by mouth.   potassium chloride  (MICRO-K ) 10 MEQ CR capsule Take 10 mEq by mouth 2 (two) times daily.   fluticasone (FLONASE) 50 MCG/ACT nasal spray Place 1 spray into both nostrils daily.   omeprazole  (PRILOSEC) 40 MG capsule Take 1 capsule (40 mg total) by mouth daily.   ondansetron  (ZOFRAN -ODT) 8 MG disintegrating tablet Take 1 tablet (8 mg total) by mouth every 8 (eight) hours as needed for nausea or vomiting.   sertraline  (ZOLOFT ) 100 MG tablet Take 100 mg by mouth daily.   Sodium Sulfate-Mag Sulfate-KCl (SUTAB ) [phone] MG TABS At 5 PM take 12 tablets using the 8 oz cup provided in the kit drinking 5 cups of water  and 5 hours before your procedure repeat the same process.   traZODone  (DESYREL ) 100 MG tablet Take 100 mg by mouth at bedtime.   [DISCONTINUED] metroNIDAZOLE  (FLAGYL ) 500 MG tablet Take 1 tablet (500 mg total) by mouth 2 (two) times daily.   No facility-administered encounter medications on file as of 10/09/2023.    Surgical History: Past Surgical History:  Procedure Laterality Date   ABDOMINAL HYSTERECTOMY  1995   partial    Medical History: Past Medical History:  Diagnosis Date   Anxiety    Depression    Splenomegaly     Family History: Non contributory to the present illness  Social History: Social History   Socioeconomic History   Marital status: Widowed    Spouse name: Not on file   Number of children: Not on file   Years of education: Not on file   Highest education  level: Not on file  Occupational History   Not on file  Tobacco Use   Smoking status: Never   Smokeless tobacco: Never  Vaping Use   Vaping status: Never Used  Substance and Sexual Activity   Alcohol use: Yes    Comment: occassional    Drug use: Never   Sexual activity: Not on file  Other Topics Concern   Not on file  Social History Narrative   Not on file   Social Drivers of Health   Financial Resource Strain: Not on file  Food Insecurity: Not on file   Transportation Needs: Not on file  Physical Activity: Not on file  Stress: Not on file  Social Connections: Not on file  Intimate Partner Violence: Not on file    Vital Signs: Blood pressure (!) 153/91, pulse 71, resp. rate 16, height 5' 2 (1.575 m), weight 162 lb (73.5 kg), SpO2 98%. Body mass index is 29.63 kg/m.   Examination: General Appearance: The patient is well-developed, well-nourished, and in no distress. Neck Circumference: 42 cm Skin: Gross inspection of skin unremarkable. Head: normocephalic, no gross deformities. Eyes: no gross deformities noted. ENT: ears appear grossly normal Neurologic: Alert and oriented. No involuntary movements.    STOP BANG RISK ASSESSMENT S (snore) Have you been told that you snore?     NO   T (tired) Are you often tired, fatigued, or sleepy during the day?   NO  O (obstruction) Do you stop breathing, choke, or gasp during sleep? NO   P (pressure) Do you have or are you being treated for high blood pressure? YES   B (BMI) Is your body index greater than 35 kg/m? NO   A (age) Are you 69 years old or older? YES   N (neck) Do you have a neck circumference greater than 16 inches?   YES   G (gender) Are you a female? NO   TOTAL STOP/BANG "YES" ANSWERS 3                                                               A STOP-Bang score of 2 or less is considered low risk, and a score of 5 or more is high risk for having either moderate or severe OSA. For people who score 3 or 4, doctors may need to perform further assessment to determine how likely they are to have OSA.         EPWORTH SLEEPINESS SCALE:  Scale:  (0)= no chance of dozing; (1)= slight chance of dozing; (2)= moderate chance of dozing; (3)= high chance of dozing  Chance  Situtation    Sitting and reading: 2    Watching TV: 0    Sitting Inactive in public: 2    As a passenger in car: 0      Lying down to rest: 2    Sitting and talking: 0    Sitting quielty  after lunch: 2    In a car, stopped in traffic: 0   TOTAL SCORE:   8 out of 24    SLEEP STUDIES:  PSG (07/021 at [facility]) AHI 9/hr, min SpO2 88%    LABS: No results found for this or any previous visit (from the past 2160 hours).  Radiology: No results found.  No results found.  No results found.    Assessment and Plan: Patient Active Problem List   Diagnosis Date Noted   Splenomegaly 11/11/2021   Chronic shoulder pain (Left) 12/17/2019   Chronic wrist pain (Left) 12/17/2019   Neurogenic pain 12/17/2019   Cervicalgia 12/17/2019   Chronic pain syndrome 12/05/2019   Pharmacologic therapy 12/05/2019   Disorder of skeletal system 12/05/2019   Problems influencing health status 12/05/2019   Abnormal hepatitis serology 10/09/2018   Diverticulitis 07/13/2018   Essential hypertension 07/13/2018   Obesity (BMI 30.0-34.9) 07/04/2018   Fatigue 10/16/2017   Obstructive sleep apnea 05/23/2014   Trigger finger, acquired 03/11/2014   Depression 05/03/2013   1. OSA (obstructive sleep apnea) (Primary) Patient evaluation suggests high risk of sleep disordered breathing due to snoring, gasping, choking,  headaches, daytime sleepiness. Patient has comorbid cardiovascular risk factors including: hypertension which could be exacerbated by pathologic sleep-disordered breathing.  Suggest: PSG  to assess/treat the patient's sleep disordered breathing. The patient was also counselled on weight loss to optimize sleep health. After study, presuming patient requalifies, she will need a new machine, as hers is past end of usable life and must be replaced.   2. Essential hypertension She is encouraged to take her hydrochlorothiazide  more consistently in light of today's blood pressure reading.         General Counseling: I have discussed the findings of the evaluation and examination with Sharlet.  I have also discussed any further diagnostic evaluation thatmay be needed or ordered today.  [place] verbalizes understanding of the findings of todays visit. We also reviewed her medications today and discussed drug interactions and side effects including but not limited excessive drowsiness and altered mental states. We also discussed that there is always a risk not just to her but also people around her. she has been encouraged to call the office with any questions or concerns that should arise related to todays visit.  No orders of the defined types were placed in this encounter.       I have personally obtained a history, evaluated the patient, evaluated pertinent data, formulated the assessment and plan and placed orders.   This patient was seen today by Lauraine Lay, PA-C in collaboration with Dr. Elfreda Bathe.   Elfreda DELENA Bathe, MD [facility] Diplomate ABMS Pulmonary and Critical Care Medicine Sleep medicine

## 2023-10-09 ENCOUNTER — Ambulatory Visit (INDEPENDENT_AMBULATORY_CARE_PROVIDER_SITE_OTHER): Payer: Medicare Other | Admitting: Internal Medicine

## 2023-10-09 VITALS — BP 153/91 | HR 71 | Resp 16 | Ht 62.0 in | Wt 162.0 lb

## 2023-10-09 DIAGNOSIS — I10 Essential (primary) hypertension: Secondary | ICD-10-CM

## 2023-10-09 DIAGNOSIS — G4733 Obstructive sleep apnea (adult) (pediatric): Secondary | ICD-10-CM

## 2023-10-09 NOTE — Patient Instructions (Signed)

## 2024-03-05 ENCOUNTER — Encounter: Payer: Self-pay | Admitting: Family Medicine

## 2024-03-08 NOTE — Progress Notes (Unsigned)
 [facility] [address] [place], [place] [address]  Pulmonary Sleep Medicine   Office Visit Note  Patient Name: Mandy Miller DOB: 14-Nov-1955 MRN [id number]    Chief Complaint: Obstructive Sleep Apnea visit  Brief History:  Mandy Miller is seen today for a follow up visit for CPAP@ 14 cmH2O. The patient has a longstanding history of sleep apnea. Patient is using PAP nightly.  The patient feels rested after sleeping with PAP.  The patient reports benefiting from PAP use. Reported sleepiness is  improved and the Epworth Sleepiness Score is 1 out of 24. The patient does not take naps. The patient complains of the following: pt complaining of dryness. Adjusted the temperature and humidity level remotely. The compliance download shows 100% compliance with an average use time of 8 hours 24 minutes. The AHI is 1.3.  The patient does not complain of limb movements disrupting sleep. The patient continues to require PAP therapy in order to eliminate sleep apnea.   ROS  General: (-) fever, (-) chills, (-) night sweat Nose and Sinuses: (-) nasal stuffiness or itchiness, (-) postnasal drip, (-) nosebleeds, (-) sinus trouble. Mouth and Throat: (-) sore throat, (-) hoarseness. Neck: (-) swollen glands, (-) enlarged thyroid, (-) neck pain. Respiratory: - cough, - shortness of breath, - wheezing. Neurologic: - numbness, - tingling. Psychiatric: + anxiety, + depression   Current Medication: Outpatient Encounter Medications as of 03/11/2024  Medication Sig   metoprolol succinate (TOPROL-XL) 25 MG 24 hr tablet Take 1 tablet by mouth daily.   fluticasone (FLONASE) 50 MCG/ACT nasal spray Place 1 spray into both nostrils daily.   hydrochlorothiazide (HYDRODIURIL) 25 MG tablet Take by mouth.   omeprazole  (PRILOSEC) 40 MG capsule Take 1 capsule (40 mg total) by mouth daily.   ondansetron  (ZOFRAN -ODT) 8 MG disintegrating tablet Take 1 tablet (8 mg total) by mouth every 8 (eight) hours as needed for  nausea or vomiting.   potassium chloride (MICRO-K) 10 MEQ CR capsule Take 10 mEq by mouth 2 (two) times daily.   pravastatin (PRAVACHOL) 40 MG tablet Take 40 mg by mouth at bedtime.   sertraline (ZOLOFT) 100 MG tablet Take 100 mg by mouth daily.   traZODone (DESYREL) 100 MG tablet Take 100 mg by mouth at bedtime.   [DISCONTINUED] Sodium Sulfate-Mag Sulfate-KCl (SUTAB ) [phone] MG TABS At 5 PM take 12 tablets using the 8 oz cup provided in the kit drinking 5 cups of water and 5 hours before your procedure repeat the same process.   No facility-administered encounter medications on file as of 03/11/2024.    Surgical History: Past Surgical History:  Procedure Laterality Date   ABDOMINAL HYSTERECTOMY  1995   partial    Medical History: Past Medical History:  Diagnosis Date   Anxiety    Depression    Splenomegaly     Family History: Non contributory to the present illness  Social History: Social History   Socioeconomic History   Marital status: Widowed    Spouse name: Not on file   Number of children: Not on file   Years of education: Not on file   Highest education level: Not on file  Occupational History   Not on file  Tobacco Use   Smoking status: Never   Smokeless tobacco: Never  Vaping Use   Vaping status: Never Used  Substance and Sexual Activity   Alcohol use: Yes    Comment: occassional    Drug use: Never   Sexual activity: Not on file  Other  Topics Concern   Not on file  Social History Narrative   Not on file   Social Drivers of Health   Financial Resource Strain: Medium Risk (12/04/2023)   Received from [facility]   Overall Financial Resource Strain (CARDIA)    Difficulty of Paying Living Expenses: Somewhat hard  Food Insecurity: Food Insecurity Present (12/04/2023)   Received from [facility]   Hunger Vital Sign    Worried About Running Out of Food in the Last Year: Sometimes true    Ran Out of Food in the Last  Year: Sometimes true  Transportation Needs: No Transportation Needs (12/04/2023)   Received from [facility] - Transportation    In the past 12 months, has lack of transportation kept you from medical appointments or from getting medications?: No    Lack of Transportation (Non-Medical): No  Physical Activity: Not on file  Stress: Not on file  Social Connections: Not on file  Intimate Partner Violence: Not on file    Vital Signs: Blood pressure 116/68, pulse 81, resp. rate 16, height 5\' 2"  (1.575 m), weight 164 lb (74.4 kg), SpO2 98%. Body mass index is 30 kg/m.    Examination: General Appearance: The patient is well-developed, well-nourished, and in no distress. Neck Circumference: 42 cm Skin: Gross inspection of skin unremarkable. Head: normocephalic, no gross deformities. Eyes: no gross deformities noted. ENT: ears appear grossly normal Neurologic: Alert and oriented. No involuntary movements.  STOP BANG RISK ASSESSMENT S (snore) Have you been told that you snore?     NO   T (tired) Are you often tired, fatigued, or sleepy during the day?   NO  O (obstruction) Do you stop breathing, choke, or gasp during sleep? NO   P (pressure) Do you have or are you being treated for high blood pressure? YES   B (BMI) Is your body index greater than 35 kg/m? NO   A (age) Are you 64 years old or older? YES   N (neck) Do you have a neck circumference greater than 16 inches?   YES   G (gender) Are you a female? NO   TOTAL STOP/BANG "YES" ANSWERS 3       A STOP-Bang score of 2 or less is considered low risk, and a score of 5 or more is high risk for having either moderate or severe OSA. For people who score 3 or 4, doctors may need to perform further assessment to determine how likely they are to have OSA.         EPWORTH SLEEPINESS SCALE:  Scale:  (0)= no chance of dozing; (1)= slight chance of dozing; (2)= moderate chance of dozing; (3)= high chance of  dozing  Chance  Situtation    Sitting and reading: 0    Watching TV: 1    Sitting Inactive in public: 0    As a passenger in car: 0      Lying down to rest: 0    Sitting and talking: 0    Sitting quielty after lunch: 0    In a car, stopped in traffic: 0   TOTAL SCORE:   1 out of 24    SLEEP STUDIES:  PSG (07/021 at [facility]) AHI 9/hr, min SpO2 88%    CPAP COMPLIANCE DATA:  Date Range: 01/08/2024-03/07/2024  Average Daily Use: 8 hours 24 minutes  Median Use: 8 hours 28 minutes  Compliance for > 4 Hours: 100%  AHI:  1.3 respiratory events per hour  Days Used: 60/60 days  Mask Leak: 48.2  95th Percentile Pressure: 14         LABS: No results found for this or any previous visit (from the past 2160 hours).  Radiology: No results found.  No results found.  No results found.    Assessment and Plan: Patient Active Problem List   Diagnosis Date Noted   CPAP use counseling 03/11/2024   OSA (obstructive sleep apnea) 10/09/2023   Splenomegaly 11/11/2021   Chronic shoulder pain (Left) 12/17/2019   Chronic wrist pain (Left) 12/17/2019   Neurogenic pain 12/17/2019   Cervicalgia 12/17/2019   Chronic pain syndrome 12/05/2019   Pharmacologic therapy 12/05/2019   Disorder of skeletal system 12/05/2019   Problems influencing health status 12/05/2019   Abnormal hepatitis serology 10/09/2018   Diverticulitis 07/13/2018   Essential hypertension 07/13/2018   Obesity (BMI 30.0-34.9) 07/04/2018   Fatigue 10/16/2017   Obstructive sleep apnea 05/23/2014   Trigger finger, acquired 03/11/2014   Depression 05/03/2013   1. OSA (obstructive sleep apnea) (Primary) The patient does tolerate PAP and reports  benefit from PAP use. The patient was reminded how to clean equipment and advised to replace supplies routinely. The patient was also counselled on weight loss. The compliance is excellent. The AHI is 1.3.   OSA on cpap- controlled. Continue with excellent  compliance with pap. CPAP continues to be medically necessary to treat this patient's OSA. F/u one year.     2. CPAP use counseling CPAP Counseling: had a lengthy discussion with the patient regarding the importance of PAP therapy in management of the sleep apnea. Patient appears to understand the risk factor reduction and also understands the risks associated with untreated sleep apnea. Patient will try to make a good faith effort to remain compliant with therapy. Also instructed the patient on proper cleaning of the device including the water must be changed daily if possible and use of distilled water is preferred. Patient understands that the machine should be regularly cleaned with appropriate recommended cleaning solutions that do not damage the PAP machine for example given white vinegar and water rinses. Other methods such as ozone treatment may not be as good as these simple methods to achieve cleaning.      General Counseling: I have discussed the findings of the evaluation and examination with Mandy Miller.  I have also discussed any further diagnostic evaluation thatmay be needed or ordered today. Mandy Miller verbalizes understanding of the findings of todays visit. We also reviewed her medications today and discussed drug interactions and side effects including but not limited excessive drowsiness and altered mental states. We also discussed that there is always a risk not just to her but also people around her. she has been encouraged to call the office with any questions or concerns that should arise related to todays visit.  No orders of the defined types were placed in this encounter.       I have personally obtained a history, examined the patient, evaluated laboratory and imaging results, formulated the assessment and plan and placed orders. This patient was seen today by Mandy Rous, PA-C in collaboration with Dr. Cam Cava.   Cordie Deters, MD [facility] Diplomate ABMS Pulmonary Critical  Care Medicine and Sleep Medicine

## 2024-03-11 ENCOUNTER — Ambulatory Visit (INDEPENDENT_AMBULATORY_CARE_PROVIDER_SITE_OTHER): Admitting: Internal Medicine

## 2024-03-11 VITALS — BP 116/68 | HR 81 | Resp 16 | Ht 62.0 in | Wt 164.0 lb

## 2024-03-11 DIAGNOSIS — Z7189 Other specified counseling: Secondary | ICD-10-CM | POA: Insufficient documentation

## 2024-03-11 DIAGNOSIS — G4733 Obstructive sleep apnea (adult) (pediatric): Secondary | ICD-10-CM

## 2024-03-11 NOTE — Patient Instructions (Signed)

## 2024-07-03 ENCOUNTER — Other Ambulatory Visit: Payer: Self-pay | Admitting: Family Medicine

## 2024-07-03 DIAGNOSIS — Z1231 Encounter for screening mammogram for malignant neoplasm of breast: Secondary | ICD-10-CM

## 2024-07-23 ENCOUNTER — Other Ambulatory Visit: Payer: Self-pay | Admitting: Cardiology

## 2024-07-23 DIAGNOSIS — R5383 Other fatigue: Secondary | ICD-10-CM

## 2024-07-23 DIAGNOSIS — I10 Essential (primary) hypertension: Secondary | ICD-10-CM

## 2024-07-23 DIAGNOSIS — Z8249 Family history of ischemic heart disease and other diseases of the circulatory system: Secondary | ICD-10-CM

## 2024-07-23 DIAGNOSIS — E785 Hyperlipidemia, unspecified: Secondary | ICD-10-CM

## 2024-07-23 DIAGNOSIS — R0609 Other forms of dyspnea: Secondary | ICD-10-CM

## 2024-07-23 DIAGNOSIS — R0789 Other chest pain: Secondary | ICD-10-CM

## 2024-07-23 DIAGNOSIS — I209 Angina pectoris, unspecified: Secondary | ICD-10-CM

## 2024-07-30 ENCOUNTER — Ambulatory Visit
Admission: RE | Admit: 2024-07-30 | Discharge: 2024-07-30 | Disposition: A | Discharge status: Home / Self Care | Source: Ambulatory Visit | Attending: Family Medicine | Admitting: Family Medicine

## 2024-07-30 DIAGNOSIS — Z1231 Encounter for screening mammogram for malignant neoplasm of breast: Secondary | ICD-10-CM | POA: Insufficient documentation

## 2024-08-09 ENCOUNTER — Telehealth (HOSPITAL_COMMUNITY): Payer: Self-pay | Admitting: Emergency Medicine

## 2024-08-09 NOTE — Telephone Encounter (Signed)
 Attempted to call patient regarding upcoming cardiac CT appointment. Left message on voicemail with name and callback number Rockwell Alexandria RN Navigator Cardiac Imaging [facility] [phone] Office [phone] Cell

## 2024-08-12 ENCOUNTER — Ambulatory Visit
Admission: RE | Admit: 2024-08-12 | Discharge: 2024-08-12 | Disposition: A | Discharge status: Home / Self Care | Source: Ambulatory Visit | Attending: Cardiology | Admitting: Cardiology

## 2024-08-12 ENCOUNTER — Other Ambulatory Visit: Payer: Self-pay | Admitting: Cardiology

## 2024-08-12 DIAGNOSIS — E785 Hyperlipidemia, unspecified: Secondary | ICD-10-CM | POA: Insufficient documentation

## 2024-08-12 DIAGNOSIS — R0609 Other forms of dyspnea: Secondary | ICD-10-CM | POA: Diagnosis not present

## 2024-08-12 DIAGNOSIS — R0789 Other chest pain: Secondary | ICD-10-CM | POA: Diagnosis present

## 2024-08-12 DIAGNOSIS — I25118 Atherosclerotic heart disease of native coronary artery with other forms of angina pectoris: Secondary | ICD-10-CM

## 2024-08-12 DIAGNOSIS — R5383 Other fatigue: Secondary | ICD-10-CM | POA: Insufficient documentation

## 2024-08-12 DIAGNOSIS — Z8249 Family history of ischemic heart disease and other diseases of the circulatory system: Secondary | ICD-10-CM | POA: Diagnosis not present

## 2024-08-12 DIAGNOSIS — I10 Essential (primary) hypertension: Secondary | ICD-10-CM | POA: Diagnosis not present

## 2024-08-12 DIAGNOSIS — I209 Angina pectoris, unspecified: Secondary | ICD-10-CM | POA: Insufficient documentation

## 2024-08-12 DIAGNOSIS — R931 Abnormal findings on diagnostic imaging of heart and coronary circulation: Secondary | ICD-10-CM | POA: Diagnosis not present

## 2024-08-12 DIAGNOSIS — I2510 Atherosclerotic heart disease of native coronary artery without angina pectoris: Secondary | ICD-10-CM | POA: Diagnosis not present

## 2024-08-12 MED ORDER — NITROGLYCERIN 0.4 MG SL SUBL
0.8000 mg | SUBLINGUAL_TABLET | Freq: Once | SUBLINGUAL | Status: AC
  Administered 2024-08-12: 0.8 mg via SUBLINGUAL

## 2024-08-12 MED ORDER — IOHEXOL 350 MG/ML SOLN
100.0000 mL | Freq: Once | INTRAVENOUS | Status: AC | PRN
  Administered 2024-08-12: 100 mL via INTRAVENOUS

## 2024-08-12 NOTE — Progress Notes (Signed)
 Patient tolerated CT well. Vital signs stable encourage to drink water throughout day.Reasons explained and verbalized understanding. Ambulated steady gait.

## 2024-08-22 ENCOUNTER — Other Ambulatory Visit: Payer: Self-pay

## 2024-08-22 ENCOUNTER — Observation Stay
Admission: RE | Admit: 2024-08-22 | Discharge: 2024-08-23 | Disposition: A | Discharge status: Home / Self Care | Attending: Internal Medicine | Admitting: Internal Medicine

## 2024-08-22 ENCOUNTER — Encounter: Admission: RE | Disposition: A | Payer: Self-pay | Source: Home / Self Care | Attending: Internal Medicine

## 2024-08-22 ENCOUNTER — Encounter: Payer: Self-pay | Admitting: Internal Medicine

## 2024-08-22 DIAGNOSIS — Z955 Presence of coronary angioplasty implant and graft: Principal | ICD-10-CM

## 2024-08-22 DIAGNOSIS — E785 Hyperlipidemia, unspecified: Secondary | ICD-10-CM | POA: Insufficient documentation

## 2024-08-22 DIAGNOSIS — Z7982 Long term (current) use of aspirin: Secondary | ICD-10-CM | POA: Insufficient documentation

## 2024-08-22 DIAGNOSIS — I10 Essential (primary) hypertension: Secondary | ICD-10-CM | POA: Diagnosis not present

## 2024-08-22 DIAGNOSIS — R931 Abnormal findings on diagnostic imaging of heart and coronary circulation: Secondary | ICD-10-CM | POA: Diagnosis not present

## 2024-08-22 DIAGNOSIS — R0789 Other chest pain: Secondary | ICD-10-CM | POA: Diagnosis not present

## 2024-08-22 DIAGNOSIS — R0609 Other forms of dyspnea: Secondary | ICD-10-CM | POA: Diagnosis not present

## 2024-08-22 DIAGNOSIS — Z79899 Other long term (current) drug therapy: Secondary | ICD-10-CM | POA: Diagnosis not present

## 2024-08-22 DIAGNOSIS — G4733 Obstructive sleep apnea (adult) (pediatric): Secondary | ICD-10-CM | POA: Diagnosis not present

## 2024-08-22 DIAGNOSIS — I25119 Atherosclerotic heart disease of native coronary artery with unspecified angina pectoris: Secondary | ICD-10-CM | POA: Diagnosis present

## 2024-08-22 LAB — POCT ACTIVATED CLOTTING TIME
Activated Clotting Time: 262 s
Activated Clotting Time: 262 s
Activated Clotting Time: 320 s

## 2024-08-22 LAB — CARDIAC CATHETERIZATION: Cath EF Quantitative: 55 %

## 2024-08-22 SURGERY — LEFT HEART CATH AND CORONARY ANGIOGRAPHY
Anesthesia: Moderate Sedation

## 2024-08-22 MED ORDER — HEPARIN (PORCINE) IN NACL 1000-0.9 UT/500ML-% IV SOLN
INTRAVENOUS | Status: AC
  Filled 2024-08-22: qty 1000

## 2024-08-22 MED ORDER — METOPROLOL SUCCINATE ER 25 MG PO TB24
25.0000 mg | ORAL_TABLET | Freq: Every day | ORAL | Status: DC
  Administered 2024-08-23: 25 mg via ORAL
  Filled 2024-08-22: qty 1

## 2024-08-22 MED ORDER — ROSUVASTATIN CALCIUM 10 MG PO TABS
40.0000 mg | ORAL_TABLET | Freq: Every day | ORAL | Status: DC
  Administered 2024-08-22 – 2024-08-23 (×2): 40 mg via ORAL
  Filled 2024-08-22 (×2): qty 4

## 2024-08-22 MED ORDER — HEPARIN SODIUM (PORCINE) 1000 UNIT/ML IJ SOLN
INTRAMUSCULAR | Status: DC | PRN
  Administered 2024-08-22: 3000 [IU] via INTRAVENOUS
  Administered 2024-08-22: 6000 [IU] via INTRAVENOUS
  Administered 2024-08-22 (×2): 3000 [IU] via INTRAVENOUS

## 2024-08-22 MED ORDER — ASPIRIN 81 MG PO CHEW: 81.0000 mg | CHEWABLE_TABLET | ORAL | Status: DC

## 2024-08-22 MED ORDER — ISOSORBIDE MONONITRATE ER 30 MG PO TB24
30.0000 mg | ORAL_TABLET | Freq: Every day | ORAL | Status: DC
  Administered 2024-08-23: 30 mg via ORAL
  Filled 2024-08-22: qty 1

## 2024-08-22 MED ORDER — PRASUGREL HCL 10 MG PO TABS
ORAL_TABLET | ORAL | Status: DC | PRN
  Administered 2024-08-22: 60 mg via ORAL

## 2024-08-22 MED ORDER — LIDOCAINE HCL 1 % IJ SOLN
INTRAMUSCULAR | Status: AC
  Filled 2024-08-22: qty 20

## 2024-08-22 MED ORDER — POTASSIUM CHLORIDE CRYS ER 10 MEQ PO TBCR
10.0000 meq | EXTENDED_RELEASE_TABLET | Freq: Once | ORAL | Status: AC
  Administered 2024-08-22: 10 meq via ORAL
  Filled 2024-08-22: qty 1

## 2024-08-22 MED ORDER — SODIUM CHLORIDE 0.9% FLUSH: 3.0000 mL | INTRAVENOUS | Status: DC | PRN

## 2024-08-22 MED ORDER — HEPARIN (PORCINE) IN NACL 1000-0.9 UT/500ML-% IV SOLN
INTRAVENOUS | Status: DC | PRN
  Administered 2024-08-22: 1000 mL

## 2024-08-22 MED ORDER — SODIUM CHLORIDE 0.9 % IV SOLN
250.0000 mL | INTRAVENOUS | Status: DC | PRN
  Administered 2024-08-22: 250 mL via INTRAVENOUS

## 2024-08-22 MED ORDER — FREE WATER
500.0000 mL | Freq: Once | Status: AC
  Administered 2024-08-22: 500 mL via ORAL

## 2024-08-22 MED ORDER — SODIUM CHLORIDE 0.9 % IV SOLN: 250.0000 mL | INTRAVENOUS | Status: AC | PRN

## 2024-08-22 MED ORDER — IOHEXOL 300 MG/ML  SOLN
INTRAMUSCULAR | Status: DC | PRN
  Administered 2024-08-22: 190 mL

## 2024-08-22 MED ORDER — ASPIRIN 81 MG PO CHEW: 81.0000 mg | CHEWABLE_TABLET | Freq: Every day | ORAL | Status: DC

## 2024-08-22 MED ORDER — HEPARIN SODIUM (PORCINE) 1000 UNIT/ML IJ SOLN
INTRAMUSCULAR | Status: AC
Start: 2024-08-22 — End: 2024-08-22
  Filled 2024-08-22: qty 10

## 2024-08-22 MED ORDER — FREE WATER: 500.0000 mL | Freq: Once | Status: DC

## 2024-08-22 MED ORDER — VERAPAMIL HCL 2.5 MG/ML IV SOLN
INTRAVENOUS | Status: DC | PRN
  Administered 2024-08-22: 2.5 mg via INTRA_ARTERIAL

## 2024-08-22 MED ORDER — ASPIRIN 81 MG PO CHEW
CHEWABLE_TABLET | ORAL | Status: AC
  Filled 2024-08-22: qty 3

## 2024-08-22 MED ORDER — PRASUGREL HCL 10 MG PO TABS
10.0000 mg | ORAL_TABLET | Freq: Every day | ORAL | Status: DC
  Administered 2024-08-23: 10 mg via ORAL
  Filled 2024-08-22: qty 1

## 2024-08-22 MED ORDER — ONDANSETRON 8 MG PO TBDP
8.0000 mg | ORAL_TABLET | Freq: Three times a day (TID) | ORAL | Status: DC | PRN
Start: 2024-08-22 — End: 2024-08-23

## 2024-08-22 MED ORDER — FENTANYL CITRATE (PF) 100 MCG/2ML IJ SOLN
INTRAMUSCULAR | Status: DC | PRN
  Administered 2024-08-22 (×2): 25 ug via INTRAVENOUS

## 2024-08-22 MED ORDER — SERTRALINE HCL 50 MG PO TABS
100.0000 mg | ORAL_TABLET | Freq: Every day | ORAL | Status: DC
  Administered 2024-08-22 – 2024-08-23 (×2): 100 mg via ORAL
  Filled 2024-08-22 (×2): qty 2

## 2024-08-22 MED ORDER — MIDAZOLAM HCL (PF) 2 MG/2ML IJ SOLN
INTRAMUSCULAR | Status: DC | PRN
  Administered 2024-08-22 (×2): 1 mg via INTRAVENOUS

## 2024-08-22 MED ORDER — HEPARIN SODIUM (PORCINE) 1000 UNIT/ML IJ SOLN
INTRAMUSCULAR | Status: AC
  Filled 2024-08-22: qty 10

## 2024-08-22 MED ORDER — ACETAMINOPHEN 325 MG PO TABS
650.0000 mg | ORAL_TABLET | ORAL | Status: DC | PRN
  Administered 2024-08-22 – 2024-08-23 (×3): 650 mg via ORAL
  Filled 2024-08-22 (×2): qty 2

## 2024-08-22 MED ORDER — FENTANYL CITRATE (PF) 100 MCG/2ML IJ SOLN
INTRAMUSCULAR | Status: AC
  Filled 2024-08-22: qty 2

## 2024-08-22 MED ORDER — LIDOCAINE HCL (PF) 1 % IJ SOLN
INTRAMUSCULAR | Status: DC | PRN
  Administered 2024-08-22: 2 mL

## 2024-08-22 MED ORDER — HYDRALAZINE HCL 20 MG/ML IJ SOLN: 10.0000 mg | INTRAMUSCULAR | Status: AC | PRN

## 2024-08-22 MED ORDER — SODIUM CHLORIDE 0.9% FLUSH
3.0000 mL | Freq: Two times a day (BID) | INTRAVENOUS | Status: DC
  Administered 2024-08-22 – 2024-08-23 (×2): 3 mL via INTRAVENOUS

## 2024-08-22 MED ORDER — ACETAMINOPHEN 325 MG PO TABS
ORAL_TABLET | ORAL | Status: AC
  Filled 2024-08-22: qty 2

## 2024-08-22 MED ORDER — TRAZODONE HCL 100 MG PO TABS
100.0000 mg | ORAL_TABLET | Freq: Every day | ORAL | Status: DC
  Administered 2024-08-22: 100 mg via ORAL
  Filled 2024-08-22: qty 1

## 2024-08-22 MED ORDER — VERAPAMIL HCL 2.5 MG/ML IV SOLN
INTRAVENOUS | Status: AC
Start: 2024-08-22 — End: 2024-08-22
  Filled 2024-08-22: qty 2

## 2024-08-22 MED ORDER — FUROSEMIDE 10 MG/ML IJ SOLN
40.0000 mg | Freq: Once | INTRAMUSCULAR | Status: AC
  Administered 2024-08-22: 40 mg via INTRAVENOUS
  Filled 2024-08-22: qty 4

## 2024-08-22 MED ORDER — ONDANSETRON HCL 4 MG/2ML IJ SOLN
4.0000 mg | Freq: Four times a day (QID) | INTRAMUSCULAR | Status: DC | PRN
Start: 2024-08-22 — End: 2024-08-23

## 2024-08-22 MED ORDER — ASPIRIN 81 MG PO CHEW
CHEWABLE_TABLET | ORAL | Status: DC | PRN
  Administered 2024-08-22: 243 mg via ORAL

## 2024-08-22 MED ORDER — PRASUGREL HCL 10 MG PO TABS
ORAL_TABLET | ORAL | Status: AC
  Filled 2024-08-22: qty 6

## 2024-08-22 MED ORDER — MIDAZOLAM HCL 2 MG/2ML IJ SOLN
INTRAMUSCULAR | Status: AC
  Filled 2024-08-22: qty 2

## 2024-08-22 MED ORDER — HYDROCHLOROTHIAZIDE 25 MG PO TABS
25.0000 mg | ORAL_TABLET | Freq: Every day | ORAL | Status: DC
  Administered 2024-08-22 – 2024-08-23 (×2): 25 mg via ORAL
  Filled 2024-08-22 (×2): qty 1

## 2024-08-22 MED ORDER — SODIUM CHLORIDE 0.9% FLUSH
3.0000 mL | Freq: Two times a day (BID) | INTRAVENOUS | Status: DC
  Administered 2024-08-22: 3 mL via INTRAVENOUS

## 2024-08-22 MED ORDER — ASPIRIN 81 MG PO TBEC
81.0000 mg | DELAYED_RELEASE_TABLET | Freq: Every day | ORAL | Status: DC
  Administered 2024-08-23: 81 mg via ORAL
  Filled 2024-08-22: qty 1

## 2024-08-22 SURGICAL SUPPLY — 17 items
BALLOON TREK RX 2.5X15 (BALLOONS) IMPLANT
BALLOON ~~LOC~~ TREK NEO RX 2.75X12 (BALLOONS) IMPLANT
CATH INFINITI 5 FR JL3.5 (CATHETERS) IMPLANT
CATH INFINITI JR4 5F (CATHETERS) IMPLANT
CATH LAUNCHER 5F EBU3.5 (CATHETERS) IMPLANT
DEVICE RAD TR BAND REGULAR (VASCULAR PRODUCTS) IMPLANT
DRAPE BRACHIAL (DRAPES) IMPLANT
GLIDESHEATH SLEND SS 6F .021 (SHEATH) IMPLANT
GUIDEWIRE INQWIRE 1.5J.035X260 (WIRE) IMPLANT
GUIDEWIRE PRESSURE X 175 (WIRE) IMPLANT
KIT ENCORE 26 ADVANTAGE (KITS) IMPLANT
PACK CARDIAC CATH (CUSTOM PROCEDURE TRAY) ×2 IMPLANT
SET ATX-X65L (MISCELLANEOUS) IMPLANT
STATION PROTECTION PRESSURIZED (MISCELLANEOUS) IMPLANT
STENT ONYX FRONTIER 2.5X18 (Permanent Stent) IMPLANT
TUBING CIL FLEX 10 FLL-RA (TUBING) IMPLANT
WIRE G HI TQ BMW 190 (WIRE) IMPLANT

## 2024-08-22 NOTE — Progress Notes (Addendum)
 Brief cardiology note not for biling   Patient presented today for outpatient LHC. Found to have severe stenosis of ramus and underwent stent placement without complications. Will be admitted for observation overnight and if no issues then will plan for discharge tomorrow morning.    SignedBETHA Danita Bloch, PA-C  08/22/24 3:17 PM

## 2024-08-22 NOTE — Progress Notes (Signed)
 Pt complained of SHOB, I helped her reposition in bed and I noticed patient was going in and out ventricular bigeminy. Cardiac team notified. No complaints from patient at this time after being repositioned.

## 2024-08-22 NOTE — Progress Notes (Signed)
 Patient still complains of periodic SHOB, provider notified, IV lasix ordered.

## 2024-08-23 ENCOUNTER — Encounter: Payer: Self-pay | Admitting: Internal Medicine

## 2024-08-23 DIAGNOSIS — I25119 Atherosclerotic heart disease of native coronary artery with unspecified angina pectoris: Secondary | ICD-10-CM | POA: Diagnosis not present

## 2024-08-23 LAB — BASIC METABOLIC PANEL WITH GFR
Anion gap: 11 (ref 5–15)
BUN: 18 mg/dL (ref 8–23)
CO2: 27 mmol/L (ref 22–32)
Calcium: 9.1 mg/dL (ref 8.9–10.3)
Chloride: 99 mmol/L (ref 98–111)
Creatinine, Ser: 0.72 mg/dL (ref 0.44–1.00)
GFR, Estimated: >60 mL/min (ref >60)
Glucose, Bld: 116 mg/dL — ABNORMAL HIGH (ref 70–99)
Potassium: 3.4 mmol/L — ABNORMAL LOW (ref 3.5–5.1)
Sodium: 137 mmol/L (ref 135–145)

## 2024-08-23 LAB — CBC
HCT: 37.4 % (ref 36.0–46.0)
Hemoglobin: 12.8 g/dL (ref 12.0–15.0)
MCH: 28.9 pg (ref 26.0–34.0)
MCHC: 34.2 g/dL (ref 30.0–36.0)
MCV: 84.4 fL (ref 80.0–100.0)
Platelets: 250 K/uL (ref 150–400)
RBC: 4.43 MIL/uL (ref 3.87–5.11)
RDW: 12.5 % (ref 11.5–15.5)
WBC: 8.2 K/uL (ref 4.0–10.5)
nRBC: 0 % (ref 0.0–0.2)

## 2024-08-23 MED ORDER — FUROSEMIDE 10 MG/ML IJ SOLN: 40.0000 mg | Freq: Once | INTRAMUSCULAR | Status: DC

## 2024-08-23 NOTE — Progress Notes (Signed)
 Brief discharge  Patient status post PCI and stent to ramus with DES Maintain aspirin  and prasugrel  6 to 12 months Continue home medications Follow-up with cardiology 1 to 2 weeks  Full discharge summary to follow  [facility]

## 2024-08-23 NOTE — Progress Notes (Incomplete)
 [facility] Cardiology    SUBJECTIVE: ***   Vitals:   08/23/24 0358 08/23/24 0753 08/23/24 1154 08/23/24 1526  BP: (!) 96/59 132/70 130/87 120/66  Pulse: 60 61 69 73  Resp: 18   15  Temp: 98 F (36.7 C) 97.9 F (36.6 C) 98 F (36.7 C) 98.1 F (36.7 C)  TempSrc:  Oral Oral Oral  SpO2: 100% 98% 98% 99%  Weight:      Height:         Intake/Output Summary (Last 24 hours) at 08/23/2024 1943 Last data filed at 08/23/2024 1153 Gross per 24 hour  Intake 360 ml  Output --  Net 360 ml      PHYSICAL EXAM  General: Well developed, well nourished, in no acute distress HEENT:  Normocephalic and atramatic Neck:  No JVD.  Lungs: Clear bilaterally to auscultation and percussion. Heart: HRRR . Normal S1 and S2 without gallops or murmurs.  Abdomen: Bowel sounds are positive, abdomen soft and non-tender  Msk:  Back normal, normal gait. Normal strength and tone for age. Extremities: No clubbing, cyanosis or edema.   Neuro: Alert and oriented X 3. Psych:  Good affect, responds appropriately   LABS: Basic Metabolic Panel: Recent Labs    08/23/24 0348  NA 137  K 3.4*  CL 99  CO2 27  GLUCOSE 116*  BUN 18  CREATININE 0.72  CALCIUM  9.1   Liver Function Tests: No results for input(s): AST, ALT, ALKPHOS, BILITOT, PROT, ALBUMIN in the last 72 hours. No results for input(s): LIPASE, AMYLASE in the last 72 hours. CBC: Recent Labs    08/23/24 0348  WBC 8.2  HGB 12.8  HCT 37.4  MCV 84.4  PLT 250   Cardiac Enzymes: No results for input(s): CKTOTAL, CKMB, CKMBINDEX, TROPONINI in the last 72 hours. BNP: Invalid input(s): POCBNP D-Dimer: No results for input(s): DDIMER in the last 72 hours. Hemoglobin A1C: No results for input(s): HGBA1C in the last 72 hours. Fasting Lipid Panel: No results for input(s): CHOL, HDL, LDLCALC, TRIG, CHOLHDL, LDLDIRECT in the last 72 hours. Thyroid Function Tests: No results for input(s): TSH, T4TOTAL,  T3FREE, THYROIDAB in the last 72 hours.  Invalid input(s): FREET3 Anemia Panel: No results for input(s): VITAMINB12, FOLATE, FERRITIN, TIBC, IRON, RETICCTPCT in the last 72 hours.  CARDIAC CATHETERIZATION Result Date: 08/22/2024   Mid RCA lesion is 25% stenosed.   Ramus lesion is 80% stenosed.   Mid LAD lesion is 50% stenosed.   Dist LAD lesion is 75% stenosed.   The left ventricular systolic function is normal.   LV end diastolic pressure is normal.   The left ventricular ejection fraction is 55-65% by visual estimate.   In the absence of any other complications or medical issues, we expect the patient to be ready for discharge from an interventional cardiology perspective.   Recommend uninterrupted dual antiplatelet therapy with Aspirin  81mg  daily and Prasugrel  10mg  daily for a minimum of 12 months (ACS-Class I recommendation).   Successful PCI and stent of stent to proximal ramus with DES Conclusion Outpatient diagnostic cardiac cath possible PCI and stent Right radial approach Left ventriculogram with normal left ventricular function EF of 55% Coronaries Left main large no significant disease LAD proximal 50% , 25% mid diffuse 75% distal LAD Ramus large 80% proximal Circumflex mid 25% lesion RCA large 25% mid Right dominant system  Intervention Successful PCI and stent to proximal ramus with DES 2.5 x 22 mm DES Postdilated with a 2.75 x 15 mm [place]if[place]  neo to 14 atm Attempted RFR the proximal LAD was unsuccessful there was malfunction of the equipment so it was deferred Patient tolerated the procedure well No complications Patient is to be treated with prasugrel  aspirin  Expect overnight stay with discharge hopefully within 23 hours     Echo ***  TELEMETRY: ***:  ASSESSMENT AND PLAN:  Principal Problem:   Status post insertion of drug eluting coronary artery stent    1. ***   Mandy JONETTA Lovelace, MD, PHD FACC 08/23/2024 7:43 PM

## 2024-08-23 NOTE — Discharge Summary (Incomplete)
   Discharge Summary      Patient ID: Mandy Miller MRN: [id number] DOB/AGE: 1955/10/25 68 y.o.  Admit date: 08/22/2024 Discharge date: 08/23/2024  Primary Discharge Diagnosis Coronary artery disease with unstable angina Secondary Discharge Diagnosis: S/p percutaneous coronary intervention  Significant Diagnostic Studies: Left heart catheterization with percutaneous coronary intervention   Consults: None  Hospital Course: The patient was brought to the cardiac cath lab and underwent left heart catheterization and coronary angiography with Dr. Florencio on 08/22/24. The patient tolerated with procedure well without complications.  The patient received 2.5 x 22 mm frontier Onyx stent to the proximal ramus . On 08/23/24 the right wrist access site was examined and found to be without significant erythema, tenderness to palpation, or apparent aneurysm. Hospital course was overall uneventful, on day of discharge the patient was ambulatory and eager to go home.  Discussed cardiac rehab and new prescription medications in detail. The patient was given aftercare instructions and ER return precautions. Will arrange for follow up in office in 1 week, or sooner if needed. Appointment date/time listed below and documented in Provider Navigator tab.  Discharge Exam: Blood pressure (!) 96/59, pulse 60, temperature 98 F (36.7 C), resp. rate 18, height 5' 3 (1.6 m), weight 77.2 kg, SpO2 100%.   General: Well appearing female, well nourished, in no acute distress.  HEENT: Normocephalic and atraumatic. Neck: No JVD.  Lungs: Normal respiratory effort on room air.  Clear to ascultation bilaterally. Heart: HRRR . Normal S1 and S2 without gallops or murmurs.  Abdomen: Non-distended appearing.  Msk: Normal strength and tone for age. Extremities: No peripheral edema. R radial access site without significant tenderness to palpation, apparent aneurysm, or significant ecchymosis. Covered with clean gauze and  tegaderm dressing.  Neuro: Alert and oriented x3 Psych: Calm and cooperative.   Labs: Lab Results  Component Value Date   WBC 8.2 08/23/2024   HGB 12.8 08/23/2024   HCT 37.4 08/23/2024   MCV 84.4 08/23/2024   PLT 250 08/23/2024    Recent Labs  Lab 08/23/24 0348  NA 137  K 3.4*  CL 99  CO2 27  BUN 18  CREATININE 0.72  CALCIUM  9.1  GLUCOSE 116*      Radiology: None EKG: Sinus bradycardia rate 59 bpm  FOLLOW UP PLANS AND APPOINTMENTS Discharge Instructions     AMB Referral to Cardiac Rehabilitation - Phase II   Complete by: As directed    Diagnosis: Coronary Stents   After initial evaluation and assessments completed: Virtual Based Care may be provided alone or in conjunction with Phase 2 Cardiac Rehab based on patient barriers.: Yes   Intensive Cardiac Rehabilitation (ICR) [facility] location only OR Traditional Cardiac Rehabilitation (TCR) *If criteria for ICR are not met will enroll in TCR ([facility] only): Yes      Allergies as of 08/23/2024   No Known Allergies   Med Rec must be completed prior to using this [facility]***       Follow up appointment: Tinnie Maiden, NP 09/02/2024 at 9:30 AM   POST-PCI CHECKLIST Aspirin  prescribed? Yes ADP Receptor Inhibitor prescribed? Yes - effient  10 mg daily Statin prescribed? Yes - crestor  40 mg daily Beta-blocker prescribed? Yes - metoprolol  succinate 25 mg daily Cardiac rehab ordered? Yes   PLEASE BRING ALL MEDICATIONS WITH YOU TO FOLLOW UP APPOINTMENTS  Time spent with patient: 39  Signed: Danita Bloch 08/23/2024, 7:22 AM [facility] Cardiology

## 2024-08-23 NOTE — Progress Notes (Signed)
 Patient given d/c summary paperwork that was provided and patient expressed understanding and will follow up with cardiology as needed. Patient aware that scripts will be sent to pharmacy once provider has time to finish med and d/c rec. Patient PIVs removed and Calvin, NT wheeled patient out to the front entrance.

## 2024-08-23 NOTE — TOC CM/SW Note (Signed)
 Transition of Care [facility]) CM/SW Note    Transition of Care [facility]) - Inpatient Brief Assessment   Patient Details  Name: Mandy Miller MRN: [id number] Date of Birth: 1956/04/07  Transition of Care [facility]) CM/SW Contact:    [name], LCSW Phone Number: 08/23/2024, 10:53 AM   Clinical Narrative:  [name] Rummer completed TOC chart review. No TOC needs identified please contact should needs arise   Transition of Care Asessment: Insurance and Status: Insurance coverage has been reviewed Patient has primary care physician: Yes (HEDRICK, [name]) Home environment has been reviewed: single family home   Prior/Current Home Services: No current home services Social Drivers of Health Review: SDOH reviewed no interventions necessary Readmission risk has been reviewed: No Transition of care needs: no transition of care needs at this time

## 2024-08-23 NOTE — Progress Notes (Signed)
 Brief note  Prasugrel  and crestor  sent to pharmacy from out pt system.

## 2024-08-23 NOTE — Plan of Care (Signed)

## 2024-08-23 NOTE — Care Management Obs Status (Signed)
 MEDICARE OBSERVATION STATUS NOTIFICATION   Patient Details  Name: Mandy Miller MRN: [id number] Date of Birth: December 09, 1955   Medicare Observation Status Notification Given:  Yes    Rojelio [name] 08/23/2024, 1:17 PM

## 2024-08-23 NOTE — Progress Notes (Addendum)
 Pt called Rn to room c/o not feeling right and light headed. This rn enters room and pt in bed stating she feels light headed but needs to have a BM. I take pt b/p to make sure stable enough to ambulate to BR. Pt to br and back to bed with me at side. Pt then proceeds to tell me she feels SOB/clammy/light headed and she fell. States before she called me in room she was getting up to [facility] and thinks she may have passed out possible and fell to her knees and crawled back in bed, pt has no c/o pain or injury. Dr florencio noitified of fall and pt complaints, no new orders.  Charge eleanor notified. Supervisor notified. Pt now instructed to call out for BR.  High fall risk implemented and alarm on.

## 2024-08-24 LAB — LIPOPROTEIN A (LPA): Lipoprotein (a): 77.2 nmol/L — ABNORMAL HIGH (ref <75.0)

## 2024-08-27 NOTE — Discharge Summary (Signed)
 Physician Discharge Summary      Patient ID: Mandy Miller MRN: [id number] DOB/AGE: 69-Jul-1957 69 y.o.  Admit date: 08/22/2024 Discharge date: 08/27/2024  Primary Discharge Diagnosis coronary disease angina Secondary Discharge Diagnosis status post PCI and stent to ramus with DES  Significant Diagnostic Studies: angiography: Cardiac cath PCI stent   Consults: None  Hospital Course: Patient presented for cardiac cath as outpatient found to have significant disease in the ramus requiring PCI and stent with DES successfully patient was placed on prasugrel  and aspirin  for 6 to 12 months and she was to continue home medication she had vague chest pain symptoms while in hospital eventually resolved and was ready for discharge   Discharge Exam: Blood pressure 120/66, pulse 73, temperature 98.1 F (36.7 C), temperature source Oral, resp. rate 15, height 5' 3 (1.6 m), weight 77.2 kg, SpO2 99%.   This SmartLink requires parameters. Parameters are variables that are added to the [facility] name to request specific information. The parameter for .curwt is the number of encounters to display readings from.    In this example, the SmartLink displays readings from the last four encounters.   General appearance: appears older than stated age Resp: clear to auscultation bilaterally Cardio: regular rate and rhythm, S1, S2 normal, no murmur, click, rub or gallop GI: soft, non-tender; bowel sounds normal; no masses,  no organomegaly Extremities: extremities normal, atraumatic, no cyanosis or edema Pulses: 2+ and symmetric Neurologic: Alert and oriented X 3, normal strength and tone. Normal symmetric reflexes. Normal coordination and gait Labs:   Lab Results  Component Value Date   WBC 8.2 08/23/2024   HGB 12.8 08/23/2024   HCT 37.4 08/23/2024   MCV 84.4 08/23/2024   PLT 250 08/23/2024    Recent Labs  Lab 08/23/24 0348  NA 137  K 3.4*  CL 99  CO2 27  BUN 18  CREATININE 0.72   CALCIUM  9.1  GLUCOSE 116*      Radiology:  EKG: Normal sinus rhythmST-T changes rate of 70  FOLLOW UP PLANS AND APPOINTMENTS Discharge Instructions     AMB Referral to Cardiac Rehabilitation - Phase II   Complete by: As directed    Diagnosis: Coronary Stents   After initial evaluation and assessments completed: Virtual Based Care may be provided alone or in conjunction with Phase 2 Cardiac Rehab based on patient barriers.: Yes   Intensive Cardiac Rehabilitation (ICR) [facility] location only OR Traditional Cardiac Rehabilitation (TCR) *If criteria for ICR are not met will enroll in TCR ([facility] only): Yes      Allergies as of 08/23/2024   No Known Allergies      Medication List     STOP taking these medications    ondansetron  8 MG disintegrating tablet Commonly known as: ZOFRAN -ODT   potassium chloride  10 MEQ CR capsule Commonly known as: MICRO-K        TAKE these medications    ALPRAZolam 0.25 MG tablet Commonly known as: XANAX Take 0.25 mg by mouth every 8 (eight) hours as needed.   aspirin  EC 81 MG tablet Take 81 mg by mouth daily. Swallow whole.   fluticasone 50 MCG/ACT nasal spray Commonly known as: FLONASE Place 1 spray into both nostrils daily as needed for allergies.   hydrochlorothiazide  25 MG tablet Commonly known as: HYDRODIURIL  Take 25 mg by mouth daily.   isosorbide  mononitrate 30 MG 24 hr tablet Commonly known as: IMDUR  Take 30 mg by mouth daily.   metoprolol  succinate 25 MG 24 hr  tablet Commonly known as: TOPROL -XL Take 1 tablet by mouth daily.   nystatin ointment Commonly known as: MYCOSTATIN Apply 1 Application topically daily as needed.   omeprazole  40 MG capsule Commonly known as: PRILOSEC Take 1 capsule (40 mg total) by mouth daily. What changed:  when to take this reasons to take this   sertraline  100 MG tablet Commonly known as: ZOLOFT  Take 100 mg by mouth daily.   SYSTANE COMPLETE OP Place 1 drop into both eyes 2 (two) times  daily.   traZODone  100 MG tablet Commonly known as: DESYREL  Take 100 mg by mouth at bedtime.   TUMS PO Take 1 tablet by mouth daily as needed.        Follow-up Information     Launie Maiden, Ronal Maxwell, NP. Go in 1 week(s).   Specialty: Nurse Practitioner Why: Appointment scheduled on 09/02/2024 at 9:30 AM Contact information: [address] [place] [address] [phone]                 BRING ALL MEDICATIONS WITH YOU TO FOLLOW UP APPOINTMENTS  Time spent with patient to include physician time: 35 minutes Signed:  Cara JONETTA Lovelace MD 08/27/2024, 7:22 PM

## 2024-09-10 ENCOUNTER — Encounter

## 2024-09-10 VITALS — Ht 63.05 in | Wt 170.4 lb

## 2024-09-10 DIAGNOSIS — Z955 Presence of coronary angioplasty implant and graft: Secondary | ICD-10-CM | POA: Diagnosis not present

## 2024-09-10 DIAGNOSIS — Z48812 Encounter for surgical aftercare following surgery on the circulatory system: Secondary | ICD-10-CM | POA: Insufficient documentation

## 2024-09-10 NOTE — Progress Notes (Signed)
 Cardiac Individual Treatment Plan  Patient Details  Name: Mandy Miller MRN: [id number] Date of Birth: 11-15-1955 Referring Provider:   Flowsheet Row Cardiac Rehab from 09/10/2024 in [facility] Cardiac and Pulmonary Rehab  Referring Provider Dr. Keller Paterson    Initial Encounter Date:  Flowsheet Row Cardiac Rehab from 09/10/2024 in [facility] Cardiac and Pulmonary Rehab  Date 09/10/24    Visit Diagnosis: Status post coronary artery stent placement  Patient's Home Medications on Admission:  Current Outpatient Medications:    ALPRAZolam (XANAX) 0.25 MG tablet, Take 0.25 mg by mouth every 8 (eight) hours as needed. (Patient not taking: Reported on 08/19/2024), Disp: , Rfl:    aspirin  EC 81 MG tablet, Take 81 mg by mouth daily. Swallow whole., Disp: , Rfl:    Calcium  Carbonate Antacid (TUMS PO), Take 1 tablet by mouth daily as needed., Disp: , Rfl:    fluticasone (FLONASE) 50 MCG/ACT nasal spray, Place 1 spray into both nostrils daily as needed for allergies., Disp: , Rfl:    hydrochlorothiazide  (HYDRODIURIL ) 25 MG tablet, Take 25 mg by mouth daily., Disp: , Rfl:    isosorbide  mononitrate (IMDUR ) 30 MG 24 hr tablet, Take 30 mg by mouth daily., Disp: , Rfl:    metoprolol  succinate (TOPROL -XL) 25 MG 24 hr tablet, Take 1 tablet by mouth daily., Disp: , Rfl:    nystatin ointment (MYCOSTATIN), Apply 1 Application topically daily as needed., Disp: , Rfl:    omeprazole  (PRILOSEC) 40 MG capsule, Take 1 capsule (40 mg total) by mouth daily. (Patient taking differently: Take 40 mg by mouth daily as needed.), Disp: 30 capsule, Rfl: 0   prasugrel  (EFFIENT ) 10 MG TABS tablet, Take 10 mg by mouth daily., Disp: , Rfl:    Propylene Glycol (SYSTANE COMPLETE OP), Place 1 drop into both eyes 2 (two) times daily., Disp: , Rfl:    rosuvastatin  (CRESTOR ) 40 MG tablet, Take 40 mg by mouth daily., Disp: , Rfl:    sertraline  (ZOLOFT ) 100 MG tablet, Take 100 mg by mouth daily., Disp: , Rfl:    traZODone  (DESYREL ) 100 MG  tablet, Take 100 mg by mouth at bedtime., Disp: , Rfl:   Past Medical History: Past Medical History:  Diagnosis Date   Anxiety    Depression    Splenomegaly     Tobacco Use: Social History   Tobacco Use  Smoking Status Never  Smokeless Tobacco Never    Labs: Review Flowsheet        No data to display           Exercise Target Goals: Exercise Program Goal: Individual exercise prescription set using results from initial 6 min walk test and THRR while considering  patient's activity barriers and safety.   Exercise Prescription Goal: Initial exercise prescription builds to 30-45 minutes a day of aerobic activity, 2-3 days per week.  Home exercise guidelines will be given to patient during program as part of exercise prescription that the participant will acknowledge.   Education: Aerobic Exercise: - Group verbal and visual presentation on the components of exercise prescription. Introduces F.I.T.T principle from ACSM for exercise prescriptions.  Reviews F.I.T.T. principles of aerobic exercise including progression. Written material provided at class time.   Education: Resistance Exercise: - Group verbal and visual presentation on the components of exercise prescription. Introduces F.I.T.T principle from ACSM for exercise prescriptions  Reviews F.I.T.T. principles of resistance exercise including progression. Written material provided at class time.    Education: Exercise & Equipment Safety: - Individual verbal instruction  and demonstration of equipment use and safety with use of the equipment. Flowsheet Row Cardiac Rehab from 09/10/2024 in [facility] Cardiac and Pulmonary Rehab  Date 09/10/24  Educator [facility]  Instruction Review Code 1- Verbalizes Understanding    Education: Exercise Physiology & General Exercise Guidelines: - Group verbal and written instruction with models to review the exercise physiology of the cardiovascular system and associated critical values. Provides  general exercise guidelines with specific guidelines to those with heart or lung disease. Written material provided at class time.   Education: Flexibility, Balance, Mind/Body Relaxation: - Group verbal and visual presentation with interactive activity on the components of exercise prescription. Introduces F.I.T.T principle from ACSM for exercise prescriptions. Reviews F.I.T.T. principles of flexibility and balance exercise training including progression. Also discusses the mind body connection.  Reviews various relaxation techniques to help reduce and manage stress (i.e. Deep breathing, progressive muscle relaxation, and visualization). Balance handout provided to take home. Written material provided at class time.   Activity Barriers & Risk Stratification:  Activity Barriers & Cardiac Risk Stratification - 09/10/24 1540       Activity Barriers & Cardiac Risk Stratification   Activity Barriers Deconditioning;Shortness of Breath;Other (comment)    Comments Lack of energy, DOE    Cardiac Risk Stratification Moderate          6 Minute Walk:  6 Minute Walk     Row Name 09/10/24 1539         6 Minute Walk   Phase Initial     Distance 1380 feet     Walk Time 6 minutes     # of Rest Breaks 0     MPH 2.6     METS 2.89     RPE 9     Perceived Dyspnea  0     VO2 Peak 10.1     Symptoms No     Resting HR 68 bpm     Resting BP 104/60     Resting Oxygen Saturation  96 %     Exercise Oxygen Saturation  during 6 min walk 97 %     Max Ex. HR 93 bpm     Max Ex. BP 142/68     2 Minute Post BP 122/64        Oxygen Initial Assessment:   Oxygen Re-Evaluation:   Oxygen Discharge (Final Oxygen Re-Evaluation):   Initial Exercise Prescription:  Initial Exercise Prescription - 09/10/24 1500       Date of Initial Exercise RX and Referring Provider   Date 09/10/24    Referring Provider Dr. Keller Paterson      Oxygen   Maintain Oxygen Saturation 88% or higher      Treadmill    MPH 2.6    Grade 0    Minutes 15    METs 2.99      Recumbant Bike   Level 3    RPM 50    Watts 25    Minutes 15    METs 2.89      NuStep   Level 3    SPM 80    Minutes 15    METs 2.89      Rower   Level 7    Watts 25    Minutes 15    METs 2.89      Prescription Details   Duration Progress to 30 minutes of continuous aerobic without signs/symptoms of physical distress      Intensity   THRR 40-80% of Max  Heartrate 101-134    Ratings of Perceived Exertion 11-13    Perceived Dyspnea 0-4      Progression   Progression Continue to progress workloads to maintain intensity without signs/symptoms of physical distress.      Resistance Training   Training Prescription Yes    Weight 4lb    Reps 10-15          Perform Capillary Blood Glucose checks as needed.  Exercise Prescription Changes:   Exercise Prescription Changes     Row Name 09/10/24 1500             Response to Exercise   Blood Pressure (Admit) 104/60       Blood Pressure (Exercise) 142/68       Blood Pressure (Exit) 122/64       Heart Rate (Admit) 68 bpm       Heart Rate (Exercise) 93 bpm       Heart Rate (Exit) 70 bpm       Oxygen Saturation (Admit) 96 %       Oxygen Saturation (Exercise) 97 %       Oxygen Saturation (Exit) 97 %       Rating of Perceived Exertion (Exercise) 9       Perceived Dyspnea (Exercise) 0       Symptoms none       Comments results          Exercise Comments:   Exercise Goals and Review:   Exercise Goals     Row Name 09/10/24 1543             Exercise Goals   Increase Physical Activity Yes       Intervention Provide advice, education, support and counseling about physical activity/exercise needs.;Develop an individualized exercise prescription for aerobic and resistive training based on initial evaluation findings, risk stratification, comorbidities and participant's personal goals.       Expected Outcomes Short Term: Attend rehab on a regular basis  to increase amount of physical activity.;Long Term: Add in home exercise to make exercise part of routine and to increase amount of physical activity.;Long Term: Exercising regularly at least 3-5 days a week.       Increase Strength and Stamina Yes       Intervention Provide advice, education, support and counseling about physical activity/exercise needs.;Develop an individualized exercise prescription for aerobic and resistive training based on initial evaluation findings, risk stratification, comorbidities and participant's personal goals.       Expected Outcomes Short Term: Increase workloads from initial exercise prescription for resistance, speed, and METs.;Short Term: Perform resistance training exercises routinely during rehab and add in resistance training at home;Long Term: Improve cardiorespiratory fitness, muscular endurance and strength as measured by increased METs and functional capacity ( )       Able to understand and use rate of perceived exertion (RPE) scale Yes       Intervention Provide education and explanation on how to use RPE scale       Expected Outcomes Short Term: Able to use RPE daily in rehab to express subjective intensity level;Long Term:  Able to use RPE to guide intensity level when exercising independently       Able to understand and use Dyspnea scale Yes       Intervention Provide education and explanation on how to use Dyspnea scale       Expected Outcomes Short Term: Able to use Dyspnea scale daily in rehab to express subjective sense  of shortness of breath during exertion;Long Term: Able to use Dyspnea scale to guide intensity level when exercising independently       Knowledge and understanding of Target Heart Rate Range (THRR) Yes       Intervention Provide education and explanation of THRR including how the numbers were predicted and where they are located for reference       Expected Outcomes Short Term: Able to state/look up THRR;Long Term: Able to use THRR  to govern intensity when exercising independently;Short Term: Able to use daily as guideline for intensity in rehab       Able to check pulse independently Yes       Intervention Provide education and demonstration on how to check pulse in carotid and radial arteries.;Review the importance of being able to check your own pulse for safety during independent exercise       Expected Outcomes Short Term: Able to explain why pulse checking is important during independent exercise;Long Term: Able to check pulse independently and accurately       Understanding of Exercise Prescription Yes       Intervention Provide education, explanation, and written materials on patient's individual exercise prescription       Expected Outcomes Short Term: Able to explain program exercise prescription;Long Term: Able to explain home exercise prescription to exercise independently          Exercise Goals Re-Evaluation :   Discharge Exercise Prescription (Final Exercise Prescription Changes):  Exercise Prescription Changes - 09/10/24 1500       Response to Exercise   Blood Pressure (Admit) 104/60    Blood Pressure (Exercise) 142/68    Blood Pressure (Exit) 122/64    Heart Rate (Admit) 68 bpm    Heart Rate (Exercise) 93 bpm    Heart Rate (Exit) 70 bpm    Oxygen Saturation (Admit) 96 %    Oxygen Saturation (Exercise) 97 %    Oxygen Saturation (Exit) 97 %    Rating of Perceived Exertion (Exercise) 9    Perceived Dyspnea (Exercise) 0    Symptoms none    Comments results          Nutrition:  Target Goals: Understanding of nutrition guidelines, daily intake of sodium 1500mg , cholesterol 200mg , calories 30% from fat and 7% or less from saturated fats, daily to have 5 or more servings of fruits and vegetables.  Education: Nutrition 1 -Group instruction provided by verbal, written material, interactive activities, discussions, models, and posters to present general guidelines for heart healthy nutrition  including macronutrients, label reading, and promoting whole foods over processed counterparts. Education serves as pensions consultant of discussion of heart healthy eating for all. Written material provided at class time.    Education: Nutrition 2 -Group instruction provided by verbal, written material, interactive activities, discussions, models, and posters to present general guidelines for heart healthy nutrition including sodium, cholesterol, and saturated fat. Providing guidance of habit forming to improve blood pressure, cholesterol, and body weight. Written material provided at class time.     Biometrics:  Pre Biometrics - 09/10/24 1543       Pre Biometrics   Height 5' 3.05 (1.601 m)    Weight 170 lb 6.4 oz (77.3 kg)    Waist Circumference 39.5 inches    Hip Circumference 39 inches    Waist to Hip Ratio 1.01 %    BMI (Calculated) 30.15    Single Leg Stand 11.25 seconds  Nutrition Therapy Plan and Nutrition Goals:   Nutrition Assessments:  MEDIFICTS Score Key: >=70 Need to make dietary changes  40-70 Heart Healthy Diet <= 40 Therapeutic Level Cholesterol Diet  Flowsheet Row Cardiac Rehab from 09/10/2024 in [facility] Cardiac and Pulmonary Rehab  Picture Your Plate Total Score on Admission 57   Picture Your Plate Scores: <59 Unhealthy dietary pattern with much room for improvement. 41-50 Dietary pattern unlikely to meet recommendations for good health and room for improvement. 51-60 More healthful dietary pattern, with some room for improvement.  >60 Healthy dietary pattern, although there may be some specific behaviors that could be improved.    Nutrition Goals Re-Evaluation:   Nutrition Goals Discharge (Final Nutrition Goals Re-Evaluation):   Psychosocial: Target Goals: Acknowledge presence or absence of significant depression and/or stress, maximize coping skills, provide positive support system. Participant is able to verbalize types and ability to use  techniques and skills needed for reducing stress and depression.   Education: Stress, Anxiety, and Depression - Group verbal and visual presentation to define topics covered.  Reviews how body is impacted by stress, anxiety, and depression.  Also discusses healthy ways to reduce stress and to treat/manage anxiety and depression. Written material provided at class time.   Education: Sleep Hygiene -Provides group verbal and written instruction about how sleep can affect your health.  Define sleep hygiene, discuss sleep cycles and impact of sleep habits. Review good sleep hygiene tips.   Initial Review & Psychosocial Screening:  Initial Psych Review & Screening - 09/10/24 1500       Initial Review   Current issues with Current Anxiety/Panic      Family Dynamics   Good Support System? Yes      Barriers   Psychosocial barriers to participate in program There are no identifiable barriers or psychosocial needs.;The patient should benefit from training in stress management and relaxation.      Screening Interventions   Interventions Encouraged to exercise;Provide feedback about the scores to participant;To provide support and resources with identified psychosocial needs    Expected Outcomes Short Term goal: Utilizing psychosocial counselor, staff and physician to assist with identification of specific Stressors or current issues interfering with healing process. Setting desired goal for each stressor or current issue identified.;Long Term Goal: Stressors or current issues are controlled or eliminated.;Short Term goal: Identification and review with participant of any Quality of Life or Depression concerns found by scoring the questionnaire.;Long Term goal: The participant improves quality of Life and PHQ9 Scores as seen by post scores and/or verbalization of changes          Quality of Life Scores:   Quality of Life - 09/10/24 1456       Quality of Life   Select Quality of Life       Quality of Life Scores   Health/Function Pre 24.27 %    Socioeconomic Pre 25.43 %    Psych/Spiritual Pre 24.71 %    Family Pre 26.4 %    GLOBAL Pre 24.91 %         Scores of 19 and below usually indicate a poorer quality of life in these areas.  A difference of  2-3 points is a clinically meaningful difference.  A difference of 2-3 points in the total score of the Quality of Life Index has been associated with significant improvement in overall quality of life, self-image, physical symptoms, and general health in studies assessing change in quality of life.  PHQ-9: Review Flowsheet  09/10/2024 12/17/2019  Depression screen PHQ 2/9  Decreased Interest 1 0  Down, Depressed, Hopeless 0 0  PHQ - 2 Score 1 0  Altered sleeping 0 -  Tired, decreased energy 2 -  Change in appetite 3 -  Feeling bad or failure about yourself  0 -  Trouble concentrating 1 -  Moving slowly or fidgety/restless 1 -  Suicidal thoughts 0 -  PHQ-9 Score 8 -  Difficult doing work/chores Somewhat difficult -   Interpretation of Total Score  Total Score Depression Severity:  1-4 = Minimal depression, 5-9 = Mild depression, 10-14 = Moderate depression, 15-19 = Moderately severe depression, 20-27 = Severe depression   Psychosocial Evaluation and Intervention:  Psychosocial Evaluation - 09/10/24 1500       Psychosocial Evaluation & Interventions   Comments Ms. Stlouis is coming to cardiac rehab for stent placement. She states she has been on zoloft  for years and it continues to work as expected. She tends to be a more anxious person in general and it has been worse with her recent heart event and her brother's two month hospital admission related to his heart. She feels like after a couple of weeks with her medication adjustment and that her brother is doing much better, that she herself feels a lot better than prior to her stent. 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 She states she is very motivated to  attend the program because she wants to work on her health and is mentally ready to jump in and get started.    Expected Outcomes Short: attend cardiac rehab for education and exercise Long: Develop and maintain positive self care habits    Continue Psychosocial Services  Follow up required by staff          Psychosocial Re-Evaluation:   Psychosocial Discharge (Final Psychosocial Re-Evaluation):   Vocational Rehabilitation: Provide vocational rehab assistance to qualifying candidates.   Vocational Rehab Evaluation & Intervention:  Vocational Rehab - 09/10/24 1442       Initial Vocational Rehab Evaluation & Intervention   Assessment shows need for Vocational Rehabilitation No          Education: Education Goals: Education classes will be provided on a variety of topics geared toward better understanding of heart health and risk factor modification. Participant will state understanding/return demonstration of topics presented as noted by education test scores.  Learning Barriers/Preferences:  Learning Barriers/Preferences - 09/10/24 1437       Learning Barriers/Preferences   Learning Barriers None    Learning Preferences None          General Cardiac Education Topics:  AED/CPR: - Group verbal and written instruction with the use of models to demonstrate the basic use of the AED with the basic ABC's of resuscitation.   Test and Procedures: - Group verbal and visual presentation and models provide information about basic cardiac anatomy and function. Reviews the testing methods done to diagnose heart disease and the outcomes of the test results. Describes the treatment choices: Medical Management, Angioplasty, or Coronary Bypass Surgery for treating various heart conditions including Myocardial Infarction, Angina, Valve Disease, and Cardiac Arrhythmias. Written material provided at class time.   Medication Safety: - Group verbal and visual instruction to review  commonly prescribed medications for heart and lung disease. Reviews the medication, class of the drug, and side effects. Includes the steps to properly store meds and maintain the prescription regimen. Written material provided at class time.   Intimacy: - Group verbal  instruction through game format to discuss how heart and lung disease can affect sexual intimacy. Written material provided at class time.   Know Your Numbers and Heart Failure: - Group verbal and visual instruction to discuss disease risk factors for cardiac and pulmonary disease and treatment options.  Reviews associated critical values for Overweight/Obesity, Hypertension, Cholesterol, and Diabetes.  Discusses basics of heart failure: signs/symptoms and treatments.  Introduces Heart Failure Zone chart for action plan for heart failure. Written material provided at class time.   Infection Prevention: - Provides verbal and written material to individual with discussion of infection control including proper hand washing and proper equipment cleaning during exercise session. Flowsheet Row Cardiac Rehab from 09/10/2024 in [facility] Cardiac and Pulmonary Rehab  Date 09/10/24  Educator [facility]  Instruction Review Code 1- Verbalizes Understanding    Falls Prevention: - Provides verbal and written material to individual with discussion of falls prevention and safety. Flowsheet Row Cardiac Rehab from 09/10/2024 in [facility] Cardiac and Pulmonary Rehab  Date 09/10/24  Educator [facility]  Instruction Review Code 1- Verbalizes Understanding    Other: -Provides group and verbal instruction on various topics (see comments)   Knowledge Questionnaire Score:  Knowledge Questionnaire Score - 09/10/24 1455       Knowledge Questionnaire Score   Pre Score 19/26          Core Components/Risk Factors/Patient Goals at Admission:  Personal Goals and Risk Factors at Admission - 09/10/24 1436       Core Components/Risk Factors/Patient Goals on Admission     Weight Management Yes;Weight Loss;Obesity    Intervention Weight Management: Develop a combined nutrition and exercise program designed to reach desired caloric intake, while maintaining appropriate intake of nutrient and fiber, sodium and fats, and appropriate energy expenditure required for the weight goal.;Weight Management: Provide education and appropriate resources to help participant work on and attain dietary goals.;Weight Management/Obesity: Establish reasonable short term and long term weight goals.    Goal Weight: Long Term 140 lb (63.5 kg)    Expected Outcomes Short Term: Continue to assess and modify interventions until short term weight is achieved;Long Term: Adherence to nutrition and physical activity/exercise program aimed toward attainment of established weight goal;Weight Loss: Understanding of general recommendations for a balanced deficit meal plan, which promotes 1-2 lb weight loss per week and includes a negative energy balance of [phone] kcal/d;Understanding recommendations for meals to include 15-35% energy as protein, 25-35% energy from fat, 35-60% energy from carbohydrates, less than 200mg  of dietary cholesterol, 20-35 gm of total fiber daily;Understanding of distribution of calorie intake throughout the day with the consumption of 4-5 meals/snacks    Hypertension Yes    Intervention Provide education on lifestyle modifcations including regular physical activity/exercise, weight management, moderate sodium restriction and increased consumption of fresh fruit, vegetables, and low fat dairy, alcohol moderation, and smoking cessation.;Monitor prescription use compliance.    Expected Outcomes Short Term: Continued assessment and intervention until BP is < 140/24mm HG in hypertensive participants. < 130/70mm HG in hypertensive participants with diabetes, heart failure or chronic kidney disease.;Long Term: Maintenance of blood pressure at goal levels.    Lipids Yes    Intervention  Provide education and support for participant on nutrition & aerobic/resistive exercise along with prescribed medications to achieve LDL 70mg , HDL >40mg .    Expected Outcomes Short Term: Participant states understanding of desired cholesterol values and is compliant with medications prescribed. Participant is following exercise prescription and nutrition guidelines.;Long Term: Cholesterol controlled with medications as prescribed,  with individualized exercise RX and with personalized nutrition plan. Value goals: LDL < 70mg , HDL > 40 mg.          Education:Diabetes - Individual verbal and written instruction to review signs/symptoms of diabetes, desired ranges of glucose level fasting, after meals and with exercise. Acknowledge that pre and post exercise glucose checks will be done for 3 sessions at entry of program.   Core Components/Risk Factors/Patient Goals Review:    Core Components/Risk Factors/Patient Goals at Discharge (Final Review):    ITP Comments:  ITP Comments     Row Name 09/10/24 1454           ITP Comments Completed program orientation and . Initial ITP created and sent for review to Medical Director.          Comments: Initial ITP

## 2024-09-10 NOTE — Patient Instructions (Signed)
 Patient Instructions  Patient Details  Name: Mandy Miller MRN: [id number] Date of Birth: [birth year] Referring Provider:  Wilburn Keller BROCKS, MD  Below are your personal goals for exercise, nutrition, and risk factors. Our goal is to help you stay on track towards obtaining and maintaining these goals. We will be discussing your progress on these goals with you throughout the program.  Initial Exercise Prescription:  Initial Exercise Prescription - 09/10/24 1500       Date of Initial Exercise RX and Referring Provider   Date 09/10/24    Referring Provider Dr. Keller Wilburn      Oxygen   Maintain Oxygen Saturation 88% or higher      Treadmill   MPH 2.6    Grade 0    Minutes 15    METs 2.99      Recumbant Bike   Level 3    RPM 50    Watts 25    Minutes 15    METs 2.89      NuStep   Level 3    SPM 80    Minutes 15    METs 2.89      Rower   Level 7    Watts 25    Minutes 15    METs 2.89      Prescription Details   Duration Progress to 30 minutes of continuous aerobic without signs/symptoms of physical distress      Intensity   THRR 40-80% of Max Heartrate 101-134    Ratings of Perceived Exertion 11-13    Perceived Dyspnea 0-4      Progression   Progression Continue to progress workloads to maintain intensity without signs/symptoms of physical distress.      Resistance Training   Training Prescription Yes    Weight 4lb    Reps 10-15          Exercise Goals: Frequency: Be able to perform aerobic exercise two to three times per week in program working toward 2-5 days per week of home exercise.  Intensity: Work with a perceived exertion of 11 (fairly light) - 15 (hard) while following your exercise prescription.  We will make changes to your prescription with you as you progress through the program.   Duration: Be able to do 30 to 45 minutes of continuous aerobic exercise in addition to a 5 minute warm-up and a 5 minute cool-down routine.   Nutrition  Goals: Your personal nutrition goals will be established when you do your nutrition analysis with the dietician.  The following are general nutrition guidelines to follow: Cholesterol < 200mg /day Sodium < 1500mg /day Fiber: Women over 50 yrs - 21 grams per day  Personal Goals:  Personal Goals and Risk Factors at Admission - 09/10/24 1436       Core Components/Risk Factors/Patient Goals on Admission    Weight Management Yes;Weight Loss;Obesity    Intervention Weight Management: Develop a combined nutrition and exercise program designed to reach desired caloric intake, while maintaining appropriate intake of nutrient and fiber, sodium and fats, and appropriate energy expenditure required for the weight goal.;Weight Management: Provide education and appropriate resources to help participant work on and attain dietary goals.;Weight Management/Obesity: Establish reasonable short term and long term weight goals.    Goal Weight: Long Term 140 lb (63.5 kg)    Expected Outcomes Short Term: Continue to assess and modify interventions until short term weight is achieved;Long Term: Adherence to nutrition and physical activity/exercise program aimed toward attainment of established weight goal;Weight  Loss: Understanding of general recommendations for a balanced deficit meal plan, which promotes 1-2 lb weight loss per week and includes a negative energy balance of [phone] kcal/d;Understanding recommendations for meals to include 15-35% energy as protein, 25-35% energy from fat, 35-60% energy from carbohydrates, less than 200mg  of dietary cholesterol, 20-35 gm of total fiber daily;Understanding of distribution of calorie intake throughout the day with the consumption of 4-5 meals/snacks    Hypertension Yes    Intervention Provide education on lifestyle modifcations including regular physical activity/exercise, weight management, moderate sodium restriction and increased consumption of fresh fruit, vegetables, and  low fat dairy, alcohol moderation, and smoking cessation.;Monitor prescription use compliance.    Expected Outcomes Short Term: Continued assessment and intervention until BP is < 140/43mm HG in hypertensive participants. < 130/37mm HG in hypertensive participants with diabetes, heart failure or chronic kidney disease.;Long Term: Maintenance of blood pressure at goal levels.    Lipids Yes    Intervention Provide education and support for participant on nutrition & aerobic/resistive exercise along with prescribed medications to achieve LDL 70mg , HDL >40mg .    Expected Outcomes Short Term: Participant states understanding of desired cholesterol values and is compliant with medications prescribed. Participant is following exercise prescription and nutrition guidelines.;Long Term: Cholesterol controlled with medications as prescribed, with individualized exercise RX and with personalized nutrition plan. Value goals: LDL < 70mg , HDL > 40 mg.          Tobacco Use Initial Evaluation: Social History   Tobacco Use  Smoking Status Never  Smokeless Tobacco Never    Exercise Goals and Review:  Exercise Goals     Row Name 09/10/24 1543             Exercise Goals   Increase Physical Activity Yes       Intervention Provide advice, education, support and counseling about physical activity/exercise needs.;Develop an individualized exercise prescription for aerobic and resistive training based on initial evaluation findings, risk stratification, comorbidities and participant's personal goals.       Expected Outcomes Short Term: Attend rehab on a regular basis to increase amount of physical activity.;Long Term: Add in home exercise to make exercise part of routine and to increase amount of physical activity.;Long Term: Exercising regularly at least 3-5 days a week.       Increase Strength and Stamina Yes       Intervention Provide advice, education, support and counseling about physical  activity/exercise needs.;Develop an individualized exercise prescription for aerobic and resistive training based on initial evaluation findings, risk stratification, comorbidities and participant's personal goals.       Expected Outcomes Short Term: Increase workloads from initial exercise prescription for resistance, speed, and METs.;Short Term: Perform resistance training exercises routinely during rehab and add in resistance training at home;Long Term: Improve cardiorespiratory fitness, muscular endurance and strength as measured by increased METs and functional capacity ( )       Able to understand and use rate of perceived exertion (RPE) scale Yes       Intervention Provide education and explanation on how to use RPE scale       Expected Outcomes Short Term: Able to use RPE daily in rehab to express subjective intensity level;Long Term:  Able to use RPE to guide intensity level when exercising independently       Able to understand and use Dyspnea scale Yes       Intervention Provide education and explanation on how to use Dyspnea scale  Expected Outcomes Short Term: Able to use Dyspnea scale daily in rehab to express subjective sense of shortness of breath during exertion;Long Term: Able to use Dyspnea scale to guide intensity level when exercising independently       Knowledge and understanding of Target Heart Rate Range (THRR) Yes       Intervention Provide education and explanation of THRR including how the numbers were predicted and where they are located for reference       Expected Outcomes Short Term: Able to state/look up THRR;Long Term: Able to use THRR to govern intensity when exercising independently;Short Term: Able to use daily as guideline for intensity in rehab       Able to check pulse independently Yes       Intervention Provide education and demonstration on how to check pulse in carotid and radial arteries.;Review the importance of being able to check your own pulse for  safety during independent exercise       Expected Outcomes Short Term: Able to explain why pulse checking is important during independent exercise;Long Term: Able to check pulse independently and accurately       Understanding of Exercise Prescription Yes       Intervention Provide education, explanation, and written materials on patient's individual exercise prescription       Expected Outcomes Short Term: Able to explain program exercise prescription;Long Term: Able to explain home exercise prescription to exercise independently          Copy of goals given to participant.

## 2024-09-13 ENCOUNTER — Encounter

## 2024-09-16 ENCOUNTER — Encounter

## 2024-09-16 DIAGNOSIS — Z955 Presence of coronary angioplasty implant and graft: Secondary | ICD-10-CM

## 2024-09-16 DIAGNOSIS — Z48812 Encounter for surgical aftercare following surgery on the circulatory system: Secondary | ICD-10-CM | POA: Diagnosis not present

## 2024-09-16 NOTE — Progress Notes (Signed)
 Daily Session Note  Patient Details  Name: Mandy Miller MRN: [id number] Date of Birth: [birth year] Referring Provider:   Flowsheet Row Cardiac Rehab from 09/10/2024 in [facility]  Referring Provider Dr. Keller Paterson    Encounter Date: 09/16/2024  Check In:  Session Check In - 09/16/24 0956       Check-In   Supervising physician immediately available to respond to emergencies See telemetry face sheet for immediately available ER MD    Location [facility]-Cardiac & Pulmonary Rehab    Staff Present Burnard Davenport RN,BSN,MPA;Maxon Burnell BS, Exercise Physiologist;[facility] Dyane BS, ACSM CEP, Exercise Physiologist    Virtual Visit No    Medication changes reported     No    Fall or balance concerns reported    No    Warm-up and Cool-down Performed on first and last piece of equipment    Resistance Training Performed Yes    VAD Patient? No    PAD/SET Patient? No      Pain Assessment   Currently in Pain? No/denies             Tobacco Use History[1]  Goals Met:  Independence with exercise equipment Exercise tolerated well No report of concerns or symptoms today Strength training completed today  Goals Unmet:  Not Applicable  Comments: First full day of exercise!  Patient was oriented to gym and equipment including functions, settings, policies, and procedures.  Patient's individual exercise prescription and treatment plan were reviewed.  All starting workloads were established based on the results of the 6 minute walk test done at initial orientation visit.  The plan for exercise progression was also introduced and progression will be customized based on patient's performance and goals.    Dr. Oneil Pinal is Medical Director for [facility] Cardiac Rehabilitation.  Dr. Fuad Aleskerov is Medical Director for [facility] Pulmonary Rehabilitation.    [1]  Social History Tobacco Use  Smoking Status Never  Smokeless Tobacco  Never

## 2024-09-18 ENCOUNTER — Encounter: Payer: Self-pay | Admitting: *Deleted

## 2024-09-18 DIAGNOSIS — Z955 Presence of coronary angioplasty implant and graft: Secondary | ICD-10-CM

## 2024-09-18 NOTE — Progress Notes (Signed)
 Cardiac Individual Treatment Plan  Patient Details  Name: Mandy Miller MRN: [id number] Date of Birth: 02/03/1956 Referring Provider:   Flowsheet Row Cardiac Rehab from 09/10/2024 in [facility] Cardiac and Pulmonary Rehab  Referring Provider Dr. Keller Paterson    Initial Encounter Date:  Flowsheet Row Cardiac Rehab from 09/10/2024 in [facility] Cardiac and Pulmonary Rehab  Date 09/10/24    Visit Diagnosis: Status post coronary artery stent placement  Patient's Home Medications on Admission: Current Medications[1]  Past Medical History: Past Medical History:  Diagnosis Date   Anxiety    Depression    Splenomegaly     Tobacco Use: Tobacco Use History[2]  Labs: Review Flowsheet        No data to display           Exercise Target Goals: Exercise Program Goal: Individual exercise prescription set using results from initial 6 min walk test and THRR while considering  patients activity barriers and safety.   Exercise Prescription Goal: Initial exercise prescription builds to 30-45 minutes a day of aerobic activity, 2-3 days per week.  Home exercise guidelines will be given to patient during program as part of exercise prescription that the participant will acknowledge.   Education: Aerobic Exercise: - Group verbal and visual presentation on the components of exercise prescription. Introduces F.I.T.T principle from ACSM for exercise prescriptions.  Reviews F.I.T.T. principles of aerobic exercise including progression. Written material provided at class time.   Education: Resistance Exercise: - Group verbal and visual presentation on the components of exercise prescription. Introduces F.I.T.T principle from ACSM for exercise prescriptions  Reviews F.I.T.T. principles of resistance exercise including progression. Written material provided at class time.    Education: Exercise & Equipment Safety: - Individual verbal instruction and demonstration of equipment use and safety with  use of the equipment. Flowsheet Row Cardiac Rehab from 09/10/2024 in [facility] Cardiac and Pulmonary Rehab  Date 09/10/24  Educator [facility]  Instruction Review Code 1- Verbalizes Understanding    Education: Exercise Physiology & General Exercise Guidelines: - Group verbal and written instruction with models to review the exercise physiology of the cardiovascular system and associated critical values. Provides general exercise guidelines with specific guidelines to those with heart or lung disease. Written material provided at class time.   Education: Flexibility, Balance, Mind/Body Relaxation: - Group verbal and visual presentation with interactive activity on the components of exercise prescription. Introduces F.I.T.T principle from ACSM for exercise prescriptions. Reviews F.I.T.T. principles of flexibility and balance exercise training including progression. Also discusses the mind body connection.  Reviews various relaxation techniques to help reduce and manage stress (i.e. Deep breathing, progressive muscle relaxation, and visualization). Balance handout provided to take home. Written material provided at class time.   Activity Barriers & Risk Stratification:  Activity Barriers & Cardiac Risk Stratification - 09/10/24 1540       Activity Barriers & Cardiac Risk Stratification   Activity Barriers Deconditioning;Shortness of Breath;Other (comment)    Comments Lack of energy, DOE    Cardiac Risk Stratification Moderate          6 Minute Walk:  6 Minute Walk     Row Name 09/10/24 1539         6 Minute Walk   Phase Initial     Distance 1380 feet     Walk Time 6 minutes     # of Rest Breaks 0     MPH 2.6     METS 2.89     RPE 9  Perceived Dyspnea  0     VO2 Peak 10.1     Symptoms No     Resting HR 68 bpm     Resting BP 104/60     Resting Oxygen Saturation  96 %     Exercise Oxygen Saturation  during 6 min walk 97 %     Max Ex. HR 93 bpm     Max Ex. BP 142/68     2 Minute  Post BP 122/64        Oxygen Initial Assessment:   Oxygen Re-Evaluation:   Oxygen Discharge (Final Oxygen Re-Evaluation):   Initial Exercise Prescription:  Initial Exercise Prescription - 09/10/24 1500       Date of Initial Exercise RX and Referring Provider   Date 09/10/24    Referring Provider Dr. Keller Paterson      Oxygen   Maintain Oxygen Saturation 88% or higher      Treadmill   MPH 2.6    Grade 0    Minutes 15    METs 2.99      Recumbant Bike   Level 3    RPM 50    Watts 25    Minutes 15    METs 2.89      NuStep   Level 3    SPM 80    Minutes 15    METs 2.89      Rower   Level 7    Watts 25    Minutes 15    METs 2.89      Prescription Details   Duration Progress to 30 minutes of continuous aerobic without signs/symptoms of physical distress      Intensity   THRR 40-80% of Max Heartrate 101-134    Ratings of Perceived Exertion 11-13    Perceived Dyspnea 0-4      Progression   Progression Continue to progress workloads to maintain intensity without signs/symptoms of physical distress.      Resistance Training   Training Prescription Yes    Weight 4lb    Reps 10-15          Perform Capillary Blood Glucose checks as needed.  Exercise Prescription Changes:   Exercise Prescription Changes     Row Name 09/10/24 1500             Response to Exercise   Blood Pressure (Admit) 104/60       Blood Pressure (Exercise) 142/68       Blood Pressure (Exit) 122/64       Heart Rate (Admit) 68 bpm       Heart Rate (Exercise) 93 bpm       Heart Rate (Exit) 70 bpm       Oxygen Saturation (Admit) 96 %       Oxygen Saturation (Exercise) 97 %       Oxygen Saturation (Exit) 97 %       Rating of Perceived Exertion (Exercise) 9       Perceived Dyspnea (Exercise) 0       Symptoms none       Comments results          Exercise Comments:   Exercise Comments     Row Name 09/16/24 [phone]           Exercise Comments First full day of  exercise!  Patient was oriented to gym and equipment including functions, settings, policies, and procedures.  Patient's individual exercise prescription and treatment plan were reviewed.  All starting workloads were established based on the results of the 6 minute walk test done at initial orientation visit.  The plan for exercise progression was also introduced and progression will be customized based on patient's performance and goals.          Exercise Goals and Review:   Exercise Goals     Row Name 09/10/24 1543             Exercise Goals   Increase Physical Activity Yes       Intervention Provide advice, education, support and counseling about physical activity/exercise needs.;Develop an individualized exercise prescription for aerobic and resistive training based on initial evaluation findings, risk stratification, comorbidities and participant's personal goals.       Expected Outcomes Short Term: Attend rehab on a regular basis to increase amount of physical activity.;Long Term: Add in home exercise to make exercise part of routine and to increase amount of physical activity.;Long Term: Exercising regularly at least 3-5 days a week.       Increase Strength and Stamina Yes       Intervention Provide advice, education, support and counseling about physical activity/exercise needs.;Develop an individualized exercise prescription for aerobic and resistive training based on initial evaluation findings, risk stratification, comorbidities and participant's personal goals.       Expected Outcomes Short Term: Increase workloads from initial exercise prescription for resistance, speed, and METs.;Short Term: Perform resistance training exercises routinely during rehab and add in resistance training at home;Long Term: Improve cardiorespiratory fitness, muscular endurance and strength as measured by increased METs and functional capacity ( )       Able to understand and use rate of perceived  exertion (RPE) scale Yes       Intervention Provide education and explanation on how to use RPE scale       Expected Outcomes Short Term: Able to use RPE daily in rehab to express subjective intensity level;Long Term:  Able to use RPE to guide intensity level when exercising independently       Able to understand and use Dyspnea scale Yes       Intervention Provide education and explanation on how to use Dyspnea scale       Expected Outcomes Short Term: Able to use Dyspnea scale daily in rehab to express subjective sense of shortness of breath during exertion;Long Term: Able to use Dyspnea scale to guide intensity level when exercising independently       Knowledge and understanding of Target Heart Rate Range (THRR) Yes       Intervention Provide education and explanation of THRR including how the numbers were predicted and where they are located for reference       Expected Outcomes Short Term: Able to state/look up THRR;Long Term: Able to use THRR to govern intensity when exercising independently;Short Term: Able to use daily as guideline for intensity in rehab       Able to check pulse independently Yes       Intervention Provide education and demonstration on how to check pulse in carotid and radial arteries.;Review the importance of being able to check your own pulse for safety during independent exercise       Expected Outcomes Short Term: Able to explain why pulse checking is important during independent exercise;Long Term: Able to check pulse independently and accurately       Understanding of Exercise Prescription Yes       Intervention Provide education, explanation, and written materials on patient's individual  exercise prescription       Expected Outcomes Short Term: Able to explain program exercise prescription;Long Term: Able to explain home exercise prescription to exercise independently          Exercise Goals Re-Evaluation :  Exercise Goals Re-Evaluation     Row Name 09/16/24  [phone]             Exercise Goal Re-Evaluation   Exercise Goals Review Increase Physical Activity;Knowledge and understanding of Target Heart Rate Range (THRR);Understanding of Exercise Prescription;Able to understand and use rate of perceived exertion (RPE) scale;Increase Strength and Stamina;Able to understand and use Dyspnea scale;Able to check pulse independently       Comments Reviewed RPE and dyspnea scale, THR and program prescription with pt today.  Pt voiced understanding and was given a copy of goals to take home.       Expected Outcomes Short: Use RPE daily to regulate intensity. Long: Follow program prescription in THR.          Discharge Exercise Prescription (Final Exercise Prescription Changes):  Exercise Prescription Changes - 09/10/24 1500       Response to Exercise   Blood Pressure (Admit) 104/60    Blood Pressure (Exercise) 142/68    Blood Pressure (Exit) 122/64    Heart Rate (Admit) 68 bpm    Heart Rate (Exercise) 93 bpm    Heart Rate (Exit) 70 bpm    Oxygen Saturation (Admit) 96 %    Oxygen Saturation (Exercise) 97 %    Oxygen Saturation (Exit) 97 %    Rating of Perceived Exertion (Exercise) 9    Perceived Dyspnea (Exercise) 0    Symptoms none    Comments results          Nutrition:  Target Goals: Understanding of nutrition guidelines, daily intake of sodium 1500mg , cholesterol 200mg , calories 30% from fat and 7% or less from saturated fats, daily to have 5 or more servings of fruits and vegetables.  Education: Nutrition 1 -Group instruction provided by verbal, written material, interactive activities, discussions, models, and posters to present general guidelines for heart healthy nutrition including macronutrients, label reading, and promoting whole foods over processed counterparts. Education serves as pensions consultant of discussion of heart healthy eating for all. Written material provided at class time.    Education: Nutrition 2 -Group instruction  provided by verbal, written material, interactive activities, discussions, models, and posters to present general guidelines for heart healthy nutrition including sodium, cholesterol, and saturated fat. Providing guidance of habit forming to improve blood pressure, cholesterol, and body weight. Written material provided at class time.     Biometrics:  Pre Biometrics - 09/10/24 1543       Pre Biometrics   Height 5' 3.05 (1.601 m)    Weight 170 lb 6.4 oz (77.3 kg)    Waist Circumference 39.5 inches    Hip Circumference 39 inches    Waist to Hip Ratio 1.01 %    BMI (Calculated) 30.15    Single Leg Stand 11.25 seconds           Nutrition Therapy Plan and Nutrition Goals:   Nutrition Assessments:  MEDIFICTS Score Key: >=70 Need to make dietary changes  40-70 Heart Healthy Diet <= 40 Therapeutic Level Cholesterol Diet  Flowsheet Row Cardiac Rehab from 09/10/2024 in [facility] Cardiac and Pulmonary Rehab  Picture Your Plate Total Score on Admission 57   Picture Your Plate Scores: <59 Unhealthy dietary pattern with much room for improvement. 41-50 Dietary pattern unlikely  to meet recommendations for good health and room for improvement. 51-60 More healthful dietary pattern, with some room for improvement.  >60 Healthy dietary pattern, although there may be some specific behaviors that could be improved.    Nutrition Goals Re-Evaluation:   Nutrition Goals Discharge (Final Nutrition Goals Re-Evaluation):   Psychosocial: Target Goals: Acknowledge presence or absence of significant depression and/or stress, maximize coping skills, provide positive support system. Participant is able to verbalize types and ability to use techniques and skills needed for reducing stress and depression.   Education: Stress, Anxiety, and Depression - Group verbal and visual presentation to define topics covered.  Reviews how body is impacted by stress, anxiety, and depression.  Also discusses healthy  ways to reduce stress and to treat/manage anxiety and depression. Written material provided at class time.   Education: Sleep Hygiene -Provides group verbal and written instruction about how sleep can affect your health.  Define sleep hygiene, discuss sleep cycles and impact of sleep habits. Review good sleep hygiene tips.   Initial Review & Psychosocial Screening:  Initial Psych Review & Screening - 09/10/24 1500       Initial Review   Current issues with Current Anxiety/Panic      Family Dynamics   Good Support System? Yes      Barriers   Psychosocial barriers to participate in program There are no identifiable barriers or psychosocial needs.;The patient should benefit from training in stress management and relaxation.      Screening Interventions   Interventions Encouraged to exercise;Provide feedback about the scores to participant;To provide support and resources with identified psychosocial needs    Expected Outcomes Short Term goal: Utilizing psychosocial counselor, staff and physician to assist with identification of specific Stressors or current issues interfering with healing process. Setting desired goal for each stressor or current issue identified.;Long Term Goal: Stressors or current issues are controlled or eliminated.;Short Term goal: Identification and review with participant of any Quality of Life or Depression concerns found by scoring the questionnaire.;Long Term goal: The participant improves quality of Life and PHQ9 Scores as seen by post scores and/or verbalization of changes          Quality of Life Scores:   Quality of Life - 09/10/24 1456       Quality of Life   Select Quality of Life      Quality of Life Scores   Health/Function Pre 24.27 %    Socioeconomic Pre 25.43 %    Psych/Spiritual Pre 24.71 %    Family Pre 26.4 %    GLOBAL Pre 24.91 %         Scores of 19 and below usually indicate a poorer quality of life in these areas.  A difference of   2-3 points is a clinically meaningful difference.  A difference of 2-3 points in the total score of the Quality of Life Index has been associated with significant improvement in overall quality of life, self-image, physical symptoms, and general health in studies assessing change in quality of life.  PHQ-9: Review Flowsheet       09/10/2024 12/17/2019  Depression screen PHQ 2/9  Decreased Interest 1 0  Down, Depressed, Hopeless 0 0  PHQ - 2 Score 1 0  Altered sleeping 0 -  Tired, decreased energy 2 -  Change in appetite 3 -  Feeling bad or failure about yourself  0 -  Trouble concentrating 1 -  Moving slowly or fidgety/restless 1 -  Suicidal thoughts 0 -  PHQ-9 Score 8 -  Difficult doing work/chores Somewhat difficult -   Interpretation of Total Score  Total Score Depression Severity:  1-4 = Minimal depression, 5-9 = Mild depression, 10-14 = Moderate depression, 15-19 = Moderately severe depression, 20-27 = Severe depression   Psychosocial Evaluation and Intervention:  Psychosocial Evaluation - 09/10/24 1500       Psychosocial Evaluation & Interventions   Comments Mandy Miller is coming to cardiac rehab for stent placement. She states she has been on zoloft  for years and it continues to work as expected. She tends to be a more anxious person in general and it has been worse with her recent heart event and her brother's two month hospital admission related to his heart. She feels like after a couple of weeks with her medication adjustment and that her brother is doing much better, that she herself feels a lot better than prior to her stent. 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 She states she is very motivated to attend the program because she wants to work on her health and is mentally ready to jump in and get started.    Expected Outcomes Short: attend cardiac rehab for education and exercise Long: Develop and maintain positive self care habits    Continue Psychosocial  Services  Follow up required by staff          Psychosocial Re-Evaluation:   Psychosocial Discharge (Final Psychosocial Re-Evaluation):   Vocational Rehabilitation: Provide vocational rehab assistance to qualifying candidates.   Vocational Rehab Evaluation & Intervention:  Vocational Rehab - 09/10/24 1442       Initial Vocational Rehab Evaluation & Intervention   Assessment shows need for Vocational Rehabilitation No          Education: Education Goals: Education classes will be provided on a variety of topics geared toward better understanding of heart health and risk factor modification. Participant will state understanding/return demonstration of topics presented as noted by education test scores.  Learning Barriers/Preferences:  Learning Barriers/Preferences - 09/10/24 1437       Learning Barriers/Preferences   Learning Barriers None    Learning Preferences None          General Cardiac Education Topics:  AED/CPR: - Group verbal and written instruction with the use of models to demonstrate the basic use of the AED with the basic ABC's of resuscitation.   Test and Procedures: - Group verbal and visual presentation and models provide information about basic cardiac anatomy and function. Reviews the testing methods done to diagnose heart disease and the outcomes of the test results. Describes the treatment choices: Medical Management, Angioplasty, or Coronary Bypass Surgery for treating various heart conditions including Myocardial Infarction, Angina, Valve Disease, and Cardiac Arrhythmias. Written material provided at class time.   Medication Safety: - Group verbal and visual instruction to review commonly prescribed medications for heart and lung disease. Reviews the medication, class of the drug, and side effects. Includes the steps to properly store meds and maintain the prescription regimen. Written material provided at class time.   Intimacy: - Group  verbal instruction through game format to discuss how heart and lung disease can affect sexual intimacy. Written material provided at class time.   Know Your Numbers and Heart Failure: - Group verbal and visual instruction to discuss disease risk factors for cardiac and pulmonary disease and treatment options.  Reviews associated critical values for Overweight/Obesity, Hypertension, Cholesterol, and Diabetes.  Discusses basics of heart failure: signs/symptoms and treatments.  Introduces  Heart Failure Zone chart for action plan for heart failure. Written material provided at class time.   Infection Prevention: - Provides verbal and written material to individual with discussion of infection control including proper hand washing and proper equipment cleaning during exercise session. Flowsheet Row Cardiac Rehab from 09/10/2024 in [facility] Cardiac and Pulmonary Rehab  Date 09/10/24  Educator [facility]  Instruction Review Code 1- Verbalizes Understanding    Falls Prevention: - Provides verbal and written material to individual with discussion of falls prevention and safety. Flowsheet Row Cardiac Rehab from 09/10/2024 in [facility] Cardiac and Pulmonary Rehab  Date 09/10/24  Educator [facility]  Instruction Review Code 1- Verbalizes Understanding    Other: -Provides group and verbal instruction on various topics (see comments)   Knowledge Questionnaire Score:  Knowledge Questionnaire Score - 09/10/24 1455       Knowledge Questionnaire Score   Pre Score 19/26          Core Components/Risk Factors/Patient Goals at Admission:  Personal Goals and Risk Factors at Admission - 09/10/24 1436       Core Components/Risk Factors/Patient Goals on Admission    Weight Management Yes;Weight Loss;Obesity    Intervention Weight Management: Develop a combined nutrition and exercise program designed to reach desired caloric intake, while maintaining appropriate intake of nutrient and fiber, sodium and fats, and appropriate  energy expenditure required for the weight goal.;Weight Management: Provide education and appropriate resources to help participant work on and attain dietary goals.;Weight Management/Obesity: Establish reasonable short term and long term weight goals.    Goal Weight: Long Term 140 lb (63.5 kg)    Expected Outcomes Short Term: Continue to assess and modify interventions until short term weight is achieved;Long Term: Adherence to nutrition and physical activity/exercise program aimed toward attainment of established weight goal;Weight Loss: Understanding of general recommendations for a balanced deficit meal plan, which promotes 1-2 lb weight loss per week and includes a negative energy balance of [phone] kcal/d;Understanding recommendations for meals to include 15-35% energy as protein, 25-35% energy from fat, 35-60% energy from carbohydrates, less than 200mg  of dietary cholesterol, 20-35 gm of total fiber daily;Understanding of distribution of calorie intake throughout the day with the consumption of 4-5 meals/snacks    Hypertension Yes    Intervention Provide education on lifestyle modifcations including regular physical activity/exercise, weight management, moderate sodium restriction and increased consumption of fresh fruit, vegetables, and low fat dairy, alcohol moderation, and smoking cessation.;Monitor prescription use compliance.    Expected Outcomes Short Term: Continued assessment and intervention until BP is < 140/24mm HG in hypertensive participants. < 130/82mm HG in hypertensive participants with diabetes, heart failure or chronic kidney disease.;Long Term: Maintenance of blood pressure at goal levels.    Lipids Yes    Intervention Provide education and support for participant on nutrition & aerobic/resistive exercise along with prescribed medications to achieve LDL 70mg , HDL >40mg .    Expected Outcomes Short Term: Participant states understanding of desired cholesterol values and is compliant  with medications prescribed. Participant is following exercise prescription and nutrition guidelines.;Long Term: Cholesterol controlled with medications as prescribed, with individualized exercise RX and with personalized nutrition plan. Value goals: LDL < 70mg , HDL > 40 mg.          Education:Diabetes - Individual verbal and written instruction to review signs/symptoms of diabetes, desired ranges of glucose level fasting, after meals and with exercise. Acknowledge that pre and post exercise glucose checks will be done for 3 sessions at entry of program.  Core Components/Risk Factors/Patient Goals Review:    Core Components/Risk Factors/Patient Goals at Discharge (Final Review):    ITP Comments:  ITP Comments     Row Name 09/10/24 1454 09/16/24 0957 09/18/24 0933       ITP Comments Completed program orientation and . Initial ITP created and sent for review to Medical Director. First full day of exercise!  Patient was oriented to gym and equipment including functions, settings, policies, and procedures.  Patient's individual exercise prescription and treatment plan were reviewed.  All starting workloads were established based on the results of the 6 minute walk test done at initial orientation visit.  The plan for exercise progression was also introduced and progression will be customized based on patient's performance and goals. 30 Day review completed. Medical Director ITP review done, changes made as directed, and signed approval by Medical Director. New to program        Comments: 30 Day Review     [1]  Current Outpatient Medications:    ALPRAZolam (XANAX) 0.25 MG tablet, Take 0.25 mg by mouth every 8 (eight) hours as needed. (Patient not taking: Reported on 08/19/2024), Disp: , Rfl:    aspirin  EC 81 MG tablet, Take 81 mg by mouth daily. Swallow whole., Disp: , Rfl:    Calcium  Carbonate Antacid (TUMS PO), Take 1 tablet by mouth daily as needed., Disp: , Rfl:    fluticasone  (FLONASE) 50 MCG/ACT nasal spray, Place 1 spray into both nostrils daily as needed for allergies., Disp: , Rfl:    hydrochlorothiazide  (HYDRODIURIL ) 25 MG tablet, Take 25 mg by mouth daily., Disp: , Rfl:    isosorbide  mononitrate (IMDUR ) 30 MG 24 hr tablet, Take 30 mg by mouth daily., Disp: , Rfl:    metoprolol  succinate (TOPROL -XL) 25 MG 24 hr tablet, Take 1 tablet by mouth daily., Disp: , Rfl:    nystatin ointment (MYCOSTATIN), Apply 1 Application topically daily as needed., Disp: , Rfl:    omeprazole  (PRILOSEC) 40 MG capsule, Take 1 capsule (40 mg total) by mouth daily. (Patient taking differently: Take 40 mg by mouth daily as needed.), Disp: 30 capsule, Rfl: 0   prasugrel  (EFFIENT ) 10 MG TABS tablet, Take 10 mg by mouth daily., Disp: , Rfl:    Propylene Glycol (SYSTANE COMPLETE OP), Place 1 drop into both eyes 2 (two) times daily., Disp: , Rfl:    rosuvastatin  (CRESTOR ) 40 MG tablet, Take 40 mg by mouth daily., Disp: , Rfl:    sertraline  (ZOLOFT ) 100 MG tablet, Take 100 mg by mouth daily., Disp: , Rfl:    traZODone  (DESYREL ) 100 MG tablet, Take 100 mg by mouth at bedtime., Disp: , Rfl:  [2]  Social History Tobacco Use  Smoking Status Never  Smokeless Tobacco Never

## 2024-09-20 ENCOUNTER — Encounter

## 2024-09-23 ENCOUNTER — Encounter

## 2024-09-30 ENCOUNTER — Encounter

## 2024-09-30 DIAGNOSIS — Z955 Presence of coronary angioplasty implant and graft: Secondary | ICD-10-CM

## 2024-09-30 DIAGNOSIS — Z48812 Encounter for surgical aftercare following surgery on the circulatory system: Secondary | ICD-10-CM | POA: Diagnosis not present

## 2024-09-30 NOTE — Progress Notes (Signed)
 Daily Session Note  Patient Details  Name: Mandy Miller MRN: [id number] Date of Birth: 1956-05-28 Referring Provider:   Flowsheet Row Cardiac Rehab from 09/10/2024 in [facility] Card[facility]  Referring Provider Dr. Keller Paterson    Encounter Date: 09/30/2024  Check In:  Session Check In - 09/30/24 0946       Check-In   Supervising physician immediately available to respond to emergencies See telemetry face sheet for immediately available ER MD    Location [facility]-Cardiac & Pulmonary Rehab    Staff Present Burnard Davenport RN,BSN,MPA;Maxon Conetta BS, Exercise Physiologist;Joseph Rolinda NORWOOD HARMAN Cecilie Delores, [place], RRT, CPFT    Virtual Visit No    Medication changes reported     No    Fall or balance concerns reported    No    Warm-up and Cool-down Performed on first and last piece of equipment    Resistance Training Performed Yes    VAD Patient? No    PAD/SET Patient? No      Pain Assessment   Currently in Pain? No/denies             Tobacco Use History[1]  Goals Met:  Independence with exercise equipment Exercise tolerated well No report of concerns or symptoms today Strength training completed today  Goals Unmet:  Not Applicable  Comments: Pt able to follow exercise prescription today without complaint.  Will continue to monitor for progression.    Dr. Oneil Pinal is Medical Director for [facility] Cardiac Rehabilitation.  Dr. Fuad Aleskerov is Medical Director for [facility] Pulmonary Rehabilitation.    [1]  Social History Tobacco Use  Smoking Status Never  Smokeless Tobacco Never

## 2024-10-11 ENCOUNTER — Encounter: Payer: Self-pay | Attending: Cardiology

## 2024-10-11 DIAGNOSIS — Z955 Presence of coronary angioplasty implant and graft: Secondary | ICD-10-CM | POA: Insufficient documentation

## 2024-10-11 DIAGNOSIS — Z48812 Encounter for surgical aftercare following surgery on the circulatory system: Secondary | ICD-10-CM | POA: Insufficient documentation

## 2024-10-16 DIAGNOSIS — Z955 Presence of coronary angioplasty implant and graft: Secondary | ICD-10-CM

## 2024-10-16 NOTE — Progress Notes (Signed)
 Cardiac Individual Treatment Plan  Patient Details  Name: Charlissa Petros MRN: [id number] Date of Birth: 1956/09/12 Referring Provider:   Flowsheet Row Cardiac Rehab from 09/10/2024 in [facility] Cardiac and Pulmonary Rehab  Referring Provider Dr. Keller Paterson    Initial Encounter Date:  Flowsheet Row Cardiac Rehab from 09/10/2024 in [facility] Cardiac and Pulmonary Rehab  Date 09/10/24    Visit Diagnosis: Status post coronary artery stent placement  Patient's Home Medications on Admission: Current Medications[1]  Past Medical History: Past Medical History:  Diagnosis Date   Anxiety    Depression    Splenomegaly     Tobacco Use: Tobacco Use History[2]  Labs: Review Flowsheet        No data to display           Exercise Target Goals: Exercise Program Goal: Individual exercise prescription set using results from initial 6 min walk test and THRR while considering  patients activity barriers and safety.   Exercise Prescription Goal: Initial exercise prescription builds to 30-45 minutes a day of aerobic activity, 2-3 days per week.  Home exercise guidelines will be given to patient during program as part of exercise prescription that the participant will acknowledge.   Education: Aerobic Exercise: - Group verbal and visual presentation on the components of exercise prescription. Introduces F.I.T.T principle from ACSM for exercise prescriptions.  Reviews F.I.T.T. principles of aerobic exercise including progression. Written material provided at class time.   Education: Resistance Exercise: - Group verbal and visual presentation on the components of exercise prescription. Introduces F.I.T.T principle from ACSM for exercise prescriptions  Reviews F.I.T.T. principles of resistance exercise including progression. Written material provided at class time.    Education: Exercise & Equipment Safety: - Individual verbal instruction and demonstration of equipment use and safety with  use of the equipment. Flowsheet Row Cardiac Rehab from 09/10/2024 in [facility] Cardiac and Pulmonary Rehab  Date 09/10/24  Educator [facility]  Instruction Review Code 1- Verbalizes Understanding    Education: Exercise Physiology & General Exercise Guidelines: - Group verbal and written instruction with models to review the exercise physiology of the cardiovascular system and associated critical values. Provides general exercise guidelines with specific guidelines to those with heart or lung disease. Written material provided at class time.   Education: Flexibility, Balance, Mind/Body Relaxation: - Group verbal and visual presentation with interactive activity on the components of exercise prescription. Introduces F.I.T.T principle from ACSM for exercise prescriptions. Reviews F.I.T.T. principles of flexibility and balance exercise training including progression. Also discusses the mind body connection.  Reviews various relaxation techniques to help reduce and manage stress (i.e. Deep breathing, progressive muscle relaxation, and visualization). Balance handout provided to take home. Written material provided at class time.   Activity Barriers & Risk Stratification:  Activity Barriers & Cardiac Risk Stratification - 09/10/24 1540       Activity Barriers & Cardiac Risk Stratification   Activity Barriers Deconditioning;Shortness of Breath;Other (comment)    Comments Lack of energy, DOE    Cardiac Risk Stratification Moderate          6 Minute Walk:  6 Minute Walk     Row Name 09/10/24 1539         6 Minute Walk   Phase Initial     Distance 1380 feet     Walk Time 6 minutes     # of Rest Breaks 0     MPH 2.6     METS 2.89     RPE 9  Perceived Dyspnea  0     VO2 Peak 10.1     Symptoms No     Resting HR 68 bpm     Resting BP 104/60     Resting Oxygen Saturation  96 %     Exercise Oxygen Saturation  during 6 min walk 97 %     Max Ex. HR 93 bpm     Max Ex. BP 142/68     2 Minute  Post BP 122/64        Oxygen Initial Assessment:   Oxygen Re-Evaluation:   Oxygen Discharge (Final Oxygen Re-Evaluation):   Initial Exercise Prescription:  Initial Exercise Prescription - 09/10/24 1500       Date of Initial Exercise RX and Referring Provider   Date 09/10/24    Referring Provider Dr. Keller Paterson      Oxygen   Maintain Oxygen Saturation 88% or higher      Treadmill   MPH 2.6    Grade 0    Minutes 15    METs 2.99      Recumbant Bike   Level 3    RPM 50    Watts 25    Minutes 15    METs 2.89      NuStep   Level 3    SPM 80    Minutes 15    METs 2.89      Rower   Level 7    Watts 25    Minutes 15    METs 2.89      Prescription Details   Duration Progress to 30 minutes of continuous aerobic without signs/symptoms of physical distress      Intensity   THRR 40-80% of Max Heartrate 101-134    Ratings of Perceived Exertion 11-13    Perceived Dyspnea 0-4      Progression   Progression Continue to progress workloads to maintain intensity without signs/symptoms of physical distress.      Resistance Training   Training Prescription Yes    Weight 4lb    Reps 10-15          Perform Capillary Blood Glucose checks as needed.  Exercise Prescription Changes:   Exercise Prescription Changes     Row Name 09/10/24 1500 09/23/24 0900 10/08/24 1500         Response to Exercise   Blood Pressure (Admit) 104/60 110/60 120/70     Blood Pressure (Exercise) 142/68 144/80 128/62     Blood Pressure (Exit) 122/64 106/62 124/74     Heart Rate (Admit) 68 bpm 83 bpm 62 bpm     Heart Rate (Exercise) 93 bpm 102 bpm 92 bpm     Heart Rate (Exit) 70 bpm 78 bpm 68 bpm     Oxygen Saturation (Admit) 96 % -- --     Oxygen Saturation (Exercise) 97 % -- --     Oxygen Saturation (Exit) 97 % -- --     Rating of Perceived Exertion (Exercise) 9 11 11      Perceived Dyspnea (Exercise) 0 -- --     Symptoms none none none     Comments results 1st day --      Duration -- Progress to 30 minutes of  aerobic without signs/symptoms of physical distress Progress to 30 minutes of  aerobic without signs/symptoms of physical distress     Intensity -- THRR unchanged THRR unchanged       Progression   Progression -- Continue to progress workloads to  maintain intensity without signs/symptoms of physical distress. Continue to progress workloads to maintain intensity without signs/symptoms of physical distress.     Average METs -- 4.83 4.79       Resistance Training   Training Prescription -- Yes Yes     Weight -- 4lb 4lb     Reps -- 10-15 10-15       Interval Training   Interval Training -- No No       Treadmill   MPH -- 2.2 2.6     Grade -- 1 10     Minutes -- 15 15     METs -- 5.72 6.57       NuStep   Level -- -- 3     Minutes -- -- 15     METs -- -- 3       Rower   Level -- 9 --     Watts -- 8 --     Minutes -- 15 --     METs -- 3.93 --       Oxygen   Maintain Oxygen Saturation -- 88% or higher 88% or higher        Exercise Comments:   Exercise Comments     Row Name 09/16/24 [phone]           Exercise Comments First full day of exercise!  Patient was oriented to gym and equipment including functions, settings, policies, and procedures.  Patient's individual exercise prescription and treatment plan were reviewed.  All starting workloads were established based on the results of the 6 minute walk test done at initial orientation visit.  The plan for exercise progression was also introduced and progression will be customized based on patient's performance and goals.          Exercise Goals and Review:   Exercise Goals     Row Name 09/10/24 1543             Exercise Goals   Increase Physical Activity Yes       Intervention Provide advice, education, support and counseling about physical activity/exercise needs.;Develop an individualized exercise prescription for aerobic and resistive training based on initial evaluation  findings, risk stratification, comorbidities and participant's personal goals.       Expected Outcomes Short Term: Attend rehab on a regular basis to increase amount of physical activity.;Long Term: Add in home exercise to make exercise part of routine and to increase amount of physical activity.;Long Term: Exercising regularly at least 3-5 days a week.       Increase Strength and Stamina Yes       Intervention Provide advice, education, support and counseling about physical activity/exercise needs.;Develop an individualized exercise prescription for aerobic and resistive training based on initial evaluation findings, risk stratification, comorbidities and participant's personal goals.       Expected Outcomes Short Term: Increase workloads from initial exercise prescription for resistance, speed, and METs.;Short Term: Perform resistance training exercises routinely during rehab and add in resistance training at home;Long Term: Improve cardiorespiratory fitness, muscular endurance and strength as measured by increased METs and functional capacity ( )       Able to understand and use rate of perceived exertion (RPE) scale Yes       Intervention Provide education and explanation on how to use RPE scale       Expected Outcomes Short Term: Able to use RPE daily in rehab to express subjective intensity level;Long Term:  Able to use RPE to guide intensity  level when exercising independently       Able to understand and use Dyspnea scale Yes       Intervention Provide education and explanation on how to use Dyspnea scale       Expected Outcomes Short Term: Able to use Dyspnea scale daily in rehab to express subjective sense of shortness of breath during exertion;Long Term: Able to use Dyspnea scale to guide intensity level when exercising independently       Knowledge and understanding of Target Heart Rate Range (THRR) Yes       Intervention Provide education and explanation of THRR including how the numbers  were predicted and where they are located for reference       Expected Outcomes Short Term: Able to state/look up THRR;Long Term: Able to use THRR to govern intensity when exercising independently;Short Term: Able to use daily as guideline for intensity in rehab       Able to check pulse independently Yes       Intervention Provide education and demonstration on how to check pulse in carotid and radial arteries.;Review the importance of being able to check your own pulse for safety during independent exercise       Expected Outcomes Short Term: Able to explain why pulse checking is important during independent exercise;Long Term: Able to check pulse independently and accurately       Understanding of Exercise Prescription Yes       Intervention Provide education, explanation, and written materials on patient's individual exercise prescription       Expected Outcomes Short Term: Able to explain program exercise prescription;Long Term: Able to explain home exercise prescription to exercise independently          Exercise Goals Re-Evaluation :  Exercise Goals Re-Evaluation     Row Name 09/16/24 0958 09/23/24 0927 10/08/24 1505         Exercise Goal Re-Evaluation   Exercise Goals Review Increase Physical Activity;Knowledge and understanding of Target Heart Rate Range (THRR);Understanding of Exercise Prescription;Able to understand and use rate of perceived exertion (RPE) scale;Increase Strength and Stamina;Able to understand and use Dyspnea scale;Able to check pulse independently Increase Physical Activity;Understanding of Exercise Prescription;Increase Strength and Stamina Increase Physical Activity;Understanding of Exercise Prescription;Increase Strength and Stamina     Comments Reviewed RPE and dyspnea scale, THR and program prescription with pt today.  Pt voiced understanding and was given a copy of goals to take home. Janal is off to a good start in the program and completed her first day in  this review period. She walked at a speed of 2.2 mph on the treadmill with 10% incline. She used to rower at level 9. We will continue to monitor her progress in the program. Anab is doing well in rehab. She attended once since the last review period with the holidays. She was able to increase her workload on the treadmill to a speed of 2.6 mph and 10% incline. She maintained level 3 on the T4 nustep. We will continue to monitor her progress in the program.     Expected Outcomes Short: Use RPE daily to regulate intensity. Long: Follow program prescription in THR. Short: Continue to follow current exercise prescription. Long: Continue exercise to improve strength and stamina. Short: Continue to progressively increase workloads. Long: Continue exercise to improve strength and stamina.        Discharge Exercise Prescription (Final Exercise Prescription Changes):  Exercise Prescription Changes - 10/08/24 1500       Response  to Exercise   Blood Pressure (Admit) 120/70    Blood Pressure (Exercise) 128/62    Blood Pressure (Exit) 124/74    Heart Rate (Admit) 62 bpm    Heart Rate (Exercise) 92 bpm    Heart Rate (Exit) 68 bpm    Rating of Perceived Exertion (Exercise) 11    Symptoms none    Duration Progress to 30 minutes of  aerobic without signs/symptoms of physical distress    Intensity THRR unchanged      Progression   Progression Continue to progress workloads to maintain intensity without signs/symptoms of physical distress.    Average METs 4.79      Resistance Training   Training Prescription Yes    Weight 4lb    Reps 10-15      Interval Training   Interval Training No      Treadmill   MPH 2.6    Grade 10    Minutes 15    METs 6.57      NuStep   Level 3    Minutes 15    METs 3      Oxygen   Maintain Oxygen Saturation 88% or higher          Nutrition:  Target Goals: Understanding of nutrition guidelines, daily intake of sodium 1500mg , cholesterol 200mg , calories  30% from fat and 7% or less from saturated fats, daily to have 5 or more servings of fruits and vegetables.  Education: Nutrition 1 -Group instruction provided by verbal, written material, interactive activities, discussions, models, and posters to present general guidelines for heart healthy nutrition including macronutrients, label reading, and promoting whole foods over processed counterparts. Education serves as pensions consultant of discussion of heart healthy eating for all. Written material provided at class time.    Education: Nutrition 2 -Group instruction provided by verbal, written material, interactive activities, discussions, models, and posters to present general guidelines for heart healthy nutrition including sodium, cholesterol, and saturated fat. Providing guidance of habit forming to improve blood pressure, cholesterol, and body weight. Written material provided at class time.     Biometrics:  Pre Biometrics - 09/10/24 1543       Pre Biometrics   Height 5' 3.05 (1.601 m)    Weight 170 lb 6.4 oz (77.3 kg)    Waist Circumference 39.5 inches    Hip Circumference 39 inches    Waist to Hip Ratio 1.01 %    BMI (Calculated) 30.15    Single Leg Stand 11.25 seconds           Nutrition Therapy Plan and Nutrition Goals:   Nutrition Assessments:  MEDIFICTS Score Key: >=70 Need to make dietary changes  40-70 Heart Healthy Diet <= 40 Therapeutic Level Cholesterol Diet  Flowsheet Row Cardiac Rehab from 09/10/2024 in [facility] Cardiac and Pulmonary Rehab  Picture Your Plate Total Score on Admission 57   Picture Your Plate Scores: <59 Unhealthy dietary pattern with much room for improvement. 41-50 Dietary pattern unlikely to meet recommendations for good health and room for improvement. 51-60 More healthful dietary pattern, with some room for improvement.  >60 Healthy dietary pattern, although there may be some specific behaviors that could be improved.    Nutrition Goals  Re-Evaluation:   Nutrition Goals Discharge (Final Nutrition Goals Re-Evaluation):   Psychosocial: Target Goals: Acknowledge presence or absence of significant depression and/or stress, maximize coping skills, provide positive support system. Participant is able to verbalize types and ability to use techniques and skills needed for reducing stress  and depression.   Education: Stress, Anxiety, and Depression - Group verbal and visual presentation to define topics covered.  Reviews how body is impacted by stress, anxiety, and depression.  Also discusses healthy ways to reduce stress and to treat/manage anxiety and depression. Written material provided at class time.   Education: Sleep Hygiene -Provides group verbal and written instruction about how sleep can affect your health.  Define sleep hygiene, discuss sleep cycles and impact of sleep habits. Review good sleep hygiene tips.   Initial Review & Psychosocial Screening:  Initial Psych Review & Screening - 09/10/24 1500       Initial Review   Current issues with Current Anxiety/Panic      Family Dynamics   Good Support System? Yes      Barriers   Psychosocial barriers to participate in program There are no identifiable barriers or psychosocial needs.;The patient should benefit from training in stress management and relaxation.      Screening Interventions   Interventions Encouraged to exercise;Provide feedback about the scores to participant;To provide support and resources with identified psychosocial needs    Expected Outcomes Short Term goal: Utilizing psychosocial counselor, staff and physician to assist with identification of specific Stressors or current issues interfering with healing process. Setting desired goal for each stressor or current issue identified.;Long Term Goal: Stressors or current issues are controlled or eliminated.;Short Term goal: Identification and review with participant of any Quality of Life or Depression  concerns found by scoring the questionnaire.;Long Term goal: The participant improves quality of Life and PHQ9 Scores as seen by post scores and/or verbalization of changes          Quality of Life Scores:   Quality of Life - 09/10/24 1456       Quality of Life   Select Quality of Life      Quality of Life Scores   Health/Function Pre 24.27 %    Socioeconomic Pre 25.43 %    Psych/Spiritual Pre 24.71 %    Family Pre 26.4 %    GLOBAL Pre 24.91 %         Scores of 19 and below usually indicate a poorer quality of life in these areas.  A difference of  2-3 points is a clinically meaningful difference.  A difference of 2-3 points in the total score of the Quality of Life Index has been associated with significant improvement in overall quality of life, self-image, physical symptoms, and general health in studies assessing change in quality of life.  PHQ-9: Review Flowsheet       09/10/2024 12/17/2019  Depression screen PHQ 2/9  Decreased Interest 1 0  Down, Depressed, Hopeless 0 0  PHQ - 2 Score 1 0  Altered sleeping 0 -  Tired, decreased energy 2 -  Change in appetite 3 -  Feeling bad or failure about yourself  0 -  Trouble concentrating 1 -  Moving slowly or fidgety/restless 1 -  Suicidal thoughts 0 -  PHQ-9 Score 8 -  Difficult doing work/chores Somewhat difficult -   Interpretation of Total Score  Total Score Depression Severity:  1-4 = Minimal depression, 5-9 = Mild depression, 10-14 = Moderate depression, 15-19 = Moderately severe depression, 20-27 = Severe depression   Psychosocial Evaluation and Intervention:  Psychosocial Evaluation - 09/10/24 1500       Psychosocial Evaluation & Interventions   Comments Ms. Ledwell is coming to cardiac rehab for stent placement. She states she has been on zoloft  for years  and it continues to work as expected. She tends to be a more anxious person in general and it has been worse with her recent heart event and her brother's two  month hospital admission related to his heart. She feels like after a couple of weeks with her medication adjustment and that her brother is doing much better, that she herself feels a lot better than prior to her stent. 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 She states she is very motivated to attend the program because she wants to work on her health and is mentally ready to jump in and get started.    Expected Outcomes Short: attend cardiac rehab for education and exercise Long: Develop and maintain positive self care habits    Continue Psychosocial Services  Follow up required by staff          Psychosocial Re-Evaluation:   Psychosocial Discharge (Final Psychosocial Re-Evaluation):   Vocational Rehabilitation: Provide vocational rehab assistance to qualifying candidates.   Vocational Rehab Evaluation & Intervention:  Vocational Rehab - 09/10/24 1442       Initial Vocational Rehab Evaluation & Intervention   Assessment shows need for Vocational Rehabilitation No          Education: Education Goals: Education classes will be provided on a variety of topics geared toward better understanding of heart health and risk factor modification. Participant will state understanding/return demonstration of topics presented as noted by education test scores.  Learning Barriers/Preferences:  Learning Barriers/Preferences - 09/10/24 1437       Learning Barriers/Preferences   Learning Barriers None    Learning Preferences None          General Cardiac Education Topics:  AED/CPR: - Group verbal and written instruction with the use of models to demonstrate the basic use of the AED with the basic ABC's of resuscitation.   Test and Procedures: - Group verbal and visual presentation and models provide information about basic cardiac anatomy and function. Reviews the testing methods done to diagnose heart disease and the outcomes of the test results. Describes the treatment  choices: Medical Management, Angioplasty, or Coronary Bypass Surgery for treating various heart conditions including Myocardial Infarction, Angina, Valve Disease, and Cardiac Arrhythmias. Written material provided at class time.   Medication Safety: - Group verbal and visual instruction to review commonly prescribed medications for heart and lung disease. Reviews the medication, class of the drug, and side effects. Includes the steps to properly store meds and maintain the prescription regimen. Written material provided at class time.   Intimacy: - Group verbal instruction through game format to discuss how heart and lung disease can affect sexual intimacy. Written material provided at class time.   Know Your Numbers and Heart Failure: - Group verbal and visual instruction to discuss disease risk factors for cardiac and pulmonary disease and treatment options.  Reviews associated critical values for Overweight/Obesity, Hypertension, Cholesterol, and Diabetes.  Discusses basics of heart failure: signs/symptoms and treatments.  Introduces Heart Failure Zone chart for action plan for heart failure. Written material provided at class time.   Infection Prevention: - Provides verbal and written material to individual with discussion of infection control including proper hand washing and proper equipment cleaning during exercise session. Flowsheet Row Cardiac Rehab from 09/10/2024 in [facility] Cardiac and Pulmonary Rehab  Date 09/10/24  Educator [facility]  Instruction Review Code 1- Verbalizes Understanding    Falls Prevention: - Provides verbal and written material to individual with discussion of falls prevention and  safety. Flowsheet Row Cardiac Rehab from 09/10/2024 in [facility] Cardiac and Pulmonary Rehab  Date 09/10/24  Educator [facility]  Instruction Review Code 1- Verbalizes Understanding    Other: -Provides group and verbal instruction on various topics (see comments)   Knowledge Questionnaire Score:   Knowledge Questionnaire Score - 09/10/24 1455       Knowledge Questionnaire Score   Pre Score 19/26          Core Components/Risk Factors/Patient Goals at Admission:  Personal Goals and Risk Factors at Admission - 09/10/24 1436       Core Components/Risk Factors/Patient Goals on Admission    Weight Management Yes;Weight Loss;Obesity    Intervention Weight Management: Develop a combined nutrition and exercise program designed to reach desired caloric intake, while maintaining appropriate intake of nutrient and fiber, sodium and fats, and appropriate energy expenditure required for the weight goal.;Weight Management: Provide education and appropriate resources to help participant work on and attain dietary goals.;Weight Management/Obesity: Establish reasonable short term and long term weight goals.    Goal Weight: Long Term 140 lb (63.5 kg)    Expected Outcomes Short Term: Continue to assess and modify interventions until short term weight is achieved;Long Term: Adherence to nutrition and physical activity/exercise program aimed toward attainment of established weight goal;Weight Loss: Understanding of general recommendations for a balanced deficit meal plan, which promotes 1-2 lb weight loss per week and includes a negative energy balance of [phone] kcal/d;Understanding recommendations for meals to include 15-35% energy as protein, 25-35% energy from fat, 35-60% energy from carbohydrates, less than 200mg  of dietary cholesterol, 20-35 gm of total fiber daily;Understanding of distribution of calorie intake throughout the day with the consumption of 4-5 meals/snacks    Hypertension Yes    Intervention Provide education on lifestyle modifcations including regular physical activity/exercise, weight management, moderate sodium restriction and increased consumption of fresh fruit, vegetables, and low fat dairy, alcohol moderation, and smoking cessation.;Monitor prescription use compliance.    Expected  Outcomes Short Term: Continued assessment and intervention until BP is < 140/47mm HG in hypertensive participants. < 130/85mm HG in hypertensive participants with diabetes, heart failure or chronic kidney disease.;Long Term: Maintenance of blood pressure at goal levels.    Lipids Yes    Intervention Provide education and support for participant on nutrition & aerobic/resistive exercise along with prescribed medications to achieve LDL 70mg , HDL >40mg .    Expected Outcomes Short Term: Participant states understanding of desired cholesterol values and is compliant with medications prescribed. Participant is following exercise prescription and nutrition guidelines.;Long Term: Cholesterol controlled with medications as prescribed, with individualized exercise RX and with personalized nutrition plan. Value goals: LDL < 70mg , HDL > 40 mg.          Education:Diabetes - Individual verbal and written instruction to review signs/symptoms of diabetes, desired ranges of glucose level fasting, after meals and with exercise. Acknowledge that pre and post exercise glucose checks will be done for 3 sessions at entry of program.   Core Components/Risk Factors/Patient Goals Review:    Core Components/Risk Factors/Patient Goals at Discharge (Final Review):    ITP Comments:  ITP Comments     Row Name 09/10/24 1454 09/16/24 0957 09/18/24 0933 10/16/24 0734     ITP Comments Completed program orientation and . Initial ITP created and sent for review to Medical Director. First full day of exercise!  Patient was oriented to gym and equipment including functions, settings, policies, and procedures.  Patient's individual exercise prescription and treatment plan were reviewed.  All starting workloads were established based on the results of the 6 minute walk test done at initial orientation visit.  The plan for exercise progression was also introduced and progression will be customized based on patient's performance  and goals. 30 Day review completed. Medical Director ITP review done, changes made as directed, and signed approval by Medical Director. New to program 30 Day review completed. Medical Director ITP review done, changes made as directed, and signed approval by Medical Director.       Comments: 30 day review     [1]  Current Outpatient Medications:    ALPRAZolam (XANAX) 0.25 MG tablet, Take 0.25 mg by mouth every 8 (eight) hours as needed. (Patient not taking: Reported on 08/19/2024), Disp: , Rfl:    aspirin  EC 81 MG tablet, Take 81 mg by mouth daily. Swallow whole., Disp: , Rfl:    Calcium  Carbonate Antacid (TUMS PO), Take 1 tablet by mouth daily as needed., Disp: , Rfl:    fluticasone (FLONASE) 50 MCG/ACT nasal spray, Place 1 spray into both nostrils daily as needed for allergies., Disp: , Rfl:    hydrochlorothiazide  (HYDRODIURIL ) 25 MG tablet, Take 25 mg by mouth daily., Disp: , Rfl:    isosorbide  mononitrate (IMDUR ) 30 MG 24 hr tablet, Take 30 mg by mouth daily., Disp: , Rfl:    metoprolol  succinate (TOPROL -XL) 25 MG 24 hr tablet, Take 1 tablet by mouth daily., Disp: , Rfl:    nystatin ointment (MYCOSTATIN), Apply 1 Application topically daily as needed., Disp: , Rfl:    omeprazole  (PRILOSEC) 40 MG capsule, Take 1 capsule (40 mg total) by mouth daily. (Patient taking differently: Take 40 mg by mouth daily as needed.), Disp: 30 capsule, Rfl: 0   prasugrel  (EFFIENT ) 10 MG TABS tablet, Take 10 mg by mouth daily., Disp: , Rfl:    Propylene Glycol (SYSTANE COMPLETE OP), Place 1 drop into both eyes 2 (two) times daily., Disp: , Rfl:    rosuvastatin  (CRESTOR ) 40 MG tablet, Take 40 mg by mouth daily., Disp: , Rfl:    sertraline  (ZOLOFT ) 100 MG tablet, Take 100 mg by mouth daily., Disp: , Rfl:    traZODone  (DESYREL ) 100 MG tablet, Take 100 mg by mouth at bedtime., Disp: , Rfl:  [2]  Social History Tobacco Use  Smoking Status Never  Smokeless Tobacco Never

## 2024-11-06 ENCOUNTER — Telehealth: Payer: Self-pay | Admitting: *Deleted

## 2024-11-06 NOTE — Telephone Encounter (Signed)
 Mandy Miller returned our call. She has been out because of a death in the family and having to take care of things related to that. She is going to try to come back to the program on 2/13 and will call if she can't.

## 2024-11-06 NOTE — Telephone Encounter (Signed)
 Left message with Mandy Miller for her to call us  back with an update as to whether she plans to return after her heart cath on 1/12. Left message on home and mobile number.

## 2024-11-14 ENCOUNTER — Ambulatory Visit: Payer: Self-pay | Admitting: Internal Medicine
